# Patient Record
Sex: Female | Born: 1955 | Race: White | Hispanic: No | Smoking: Never smoker
Health system: Southern US, Community
[De-identification: ages and names within clinical notes are randomized; demographics above are authoritative.]

## PROBLEM LIST (undated history)

## (undated) DIAGNOSIS — I10 Essential (primary) hypertension: Secondary | ICD-10-CM

## (undated) DIAGNOSIS — Z780 Asymptomatic menopausal state: Secondary | ICD-10-CM

## (undated) DIAGNOSIS — G43909 Migraine, unspecified, not intractable, without status migrainosus: Secondary | ICD-10-CM

## (undated) DIAGNOSIS — E785 Hyperlipidemia, unspecified: Secondary | ICD-10-CM

## (undated) DIAGNOSIS — L817 Pigmented purpuric dermatosis: Secondary | ICD-10-CM

## (undated) HISTORY — DX: Asymptomatic menopausal state: Z78.0

## (undated) HISTORY — PX: CHOLECYSTECTOMY: SHX55

## (undated) HISTORY — PX: TONSILLECTOMY: SUR1361

## (undated) HISTORY — DX: Pigmented purpuric dermatosis: L81.7

## (undated) HISTORY — PX: FOOT SURGERY: SHX648

## (undated) HISTORY — DX: Migraine, unspecified, not intractable, without status migrainosus: G43.909

## (undated) HISTORY — DX: Hyperlipidemia, unspecified: E78.5

## (undated) HISTORY — DX: Essential (primary) hypertension: I10

---

## 1997-04-14 ENCOUNTER — Ambulatory Visit (HOSPITAL_BASED_OUTPATIENT_CLINIC_OR_DEPARTMENT_OTHER): Admission: RE | Admit: 1997-04-14 | Discharge: 1997-04-14 | Payer: Self-pay | Admitting: Orthopedic Surgery

## 1997-06-01 ENCOUNTER — Ambulatory Visit (HOSPITAL_COMMUNITY): Admission: RE | Admit: 1997-06-01 | Discharge: 1997-06-01 | Payer: Self-pay | Admitting: *Deleted

## 1998-12-01 ENCOUNTER — Other Ambulatory Visit: Admission: RE | Admit: 1998-12-01 | Discharge: 1998-12-01 | Payer: Self-pay | Admitting: *Deleted

## 1999-02-25 ENCOUNTER — Emergency Department (HOSPITAL_COMMUNITY): Admission: EM | Admit: 1999-02-25 | Discharge: 1999-02-25 | Payer: Self-pay | Admitting: Emergency Medicine

## 1999-12-14 ENCOUNTER — Other Ambulatory Visit: Admission: RE | Admit: 1999-12-14 | Discharge: 1999-12-14 | Payer: Self-pay | Admitting: *Deleted

## 2000-04-30 ENCOUNTER — Ambulatory Visit (HOSPITAL_COMMUNITY): Admission: RE | Admit: 2000-04-30 | Discharge: 2000-04-30 | Payer: Self-pay | Admitting: Gastroenterology

## 2000-05-21 ENCOUNTER — Ambulatory Visit (HOSPITAL_COMMUNITY): Admission: RE | Admit: 2000-05-21 | Discharge: 2000-05-21 | Payer: Self-pay | Admitting: *Deleted

## 2000-05-21 ENCOUNTER — Encounter: Payer: Self-pay | Admitting: *Deleted

## 2000-05-22 ENCOUNTER — Encounter: Payer: Self-pay | Admitting: *Deleted

## 2000-05-22 ENCOUNTER — Encounter: Admission: RE | Admit: 2000-05-22 | Discharge: 2000-05-22 | Payer: Self-pay | Admitting: *Deleted

## 2000-11-18 ENCOUNTER — Encounter: Payer: Self-pay | Admitting: *Deleted

## 2000-11-18 ENCOUNTER — Encounter: Admission: RE | Admit: 2000-11-18 | Discharge: 2000-11-18 | Payer: Self-pay | Admitting: *Deleted

## 2000-12-15 ENCOUNTER — Other Ambulatory Visit: Admission: RE | Admit: 2000-12-15 | Discharge: 2000-12-15 | Payer: Self-pay | Admitting: *Deleted

## 2001-05-29 ENCOUNTER — Encounter: Payer: Self-pay | Admitting: *Deleted

## 2001-05-29 ENCOUNTER — Ambulatory Visit (HOSPITAL_COMMUNITY): Admission: RE | Admit: 2001-05-29 | Discharge: 2001-05-29 | Payer: Self-pay | Admitting: *Deleted

## 2002-01-28 ENCOUNTER — Other Ambulatory Visit: Admission: RE | Admit: 2002-01-28 | Discharge: 2002-01-28 | Payer: Self-pay | Admitting: *Deleted

## 2002-05-19 ENCOUNTER — Ambulatory Visit (HOSPITAL_COMMUNITY): Admission: RE | Admit: 2002-05-19 | Discharge: 2002-05-19 | Payer: Self-pay | Admitting: *Deleted

## 2002-05-19 ENCOUNTER — Encounter: Payer: Self-pay | Admitting: *Deleted

## 2003-02-03 ENCOUNTER — Other Ambulatory Visit: Admission: RE | Admit: 2003-02-03 | Discharge: 2003-02-03 | Payer: Self-pay | Admitting: *Deleted

## 2003-05-20 ENCOUNTER — Ambulatory Visit (HOSPITAL_COMMUNITY): Admission: RE | Admit: 2003-05-20 | Discharge: 2003-05-20 | Payer: Self-pay | Admitting: *Deleted

## 2004-02-09 ENCOUNTER — Other Ambulatory Visit: Admission: RE | Admit: 2004-02-09 | Discharge: 2004-02-09 | Payer: Self-pay | Admitting: *Deleted

## 2004-05-22 ENCOUNTER — Ambulatory Visit (HOSPITAL_COMMUNITY): Admission: RE | Admit: 2004-05-22 | Discharge: 2004-05-22 | Payer: Self-pay | Admitting: *Deleted

## 2005-05-23 ENCOUNTER — Encounter: Admission: RE | Admit: 2005-05-23 | Discharge: 2005-05-23 | Payer: Self-pay | Admitting: Internal Medicine

## 2005-05-27 ENCOUNTER — Encounter: Admission: RE | Admit: 2005-05-27 | Discharge: 2005-05-27 | Payer: Self-pay | Admitting: Internal Medicine

## 2006-05-26 ENCOUNTER — Encounter: Admission: RE | Admit: 2006-05-26 | Discharge: 2006-05-26 | Payer: Self-pay | Admitting: Obstetrics and Gynecology

## 2006-06-11 ENCOUNTER — Other Ambulatory Visit: Admission: RE | Admit: 2006-06-11 | Discharge: 2006-06-11 | Payer: Self-pay | Admitting: Obstetrics and Gynecology

## 2007-05-28 ENCOUNTER — Encounter: Admission: RE | Admit: 2007-05-28 | Discharge: 2007-05-28 | Payer: Self-pay | Admitting: Obstetrics and Gynecology

## 2007-06-15 ENCOUNTER — Other Ambulatory Visit: Admission: RE | Admit: 2007-06-15 | Discharge: 2007-06-15 | Payer: Self-pay | Admitting: Obstetrics & Gynecology

## 2007-06-23 ENCOUNTER — Encounter: Admission: RE | Admit: 2007-06-23 | Discharge: 2007-06-23 | Payer: Self-pay | Admitting: Obstetrics and Gynecology

## 2008-06-06 ENCOUNTER — Encounter: Admission: RE | Admit: 2008-06-06 | Discharge: 2008-06-06 | Payer: Self-pay | Admitting: Obstetrics and Gynecology

## 2009-06-04 ENCOUNTER — Emergency Department (HOSPITAL_COMMUNITY): Admission: EM | Admit: 2009-06-04 | Discharge: 2009-06-04 | Payer: Self-pay | Admitting: Emergency Medicine

## 2009-06-30 ENCOUNTER — Encounter: Admission: RE | Admit: 2009-06-30 | Discharge: 2009-06-30 | Payer: Self-pay | Admitting: Internal Medicine

## 2010-06-01 NOTE — Procedures (Signed)
[place]  Patient:    Jordan Haynes, Jordan Haynes                  MRN: [id number] Proc. Date: 04/30/00 Adm. Date:  [id number] Attending:  Rich Brave CC:         Tomi Bamberger, FNP, [facility]  Family Medicine   Procedure Report  PROCEDURE PERFORMED:  Colonoscopy  ENDOSCOPIST:  Florencia Reasons, M.D.  INDICATIONS FOR PROCEDURE:  The patient is a 55 year old with a family history of colon cancer at a very early age.  Her most recent screening examination, by me, was performed about five years ago and was negative for polyps at that time.  FINDINGS:  Normal except for sigmoid diverticulosis.  DESCRIPTION OF PROCEDURE:  The nature, purpose and risks of the procedure had been discussed with the patient, who provided written consent for the procedure.  Sedation was fentanyl 80 mcg and Versed 8 mg IV prior to and during the course of the procedure without arrhythmias or desaturation.  The Olympus adjustable tension pediatric video colonoscope was quite easily advanced to the cecum and pull-back was then performed.  The cecum was identified by clear visualization of the appendiceal orifice.  The quality of the prep was very good although we did have to use a fair amount of irrigation to get rid of film.  It was felt that all areas were well seen.  There was a moderate amount of sigmoid diverticulosis but this was otherwise a normal examination, without evidence of polyps, cancer, colitis or vascular malformations.  I thought I saw a small 2 x 3 mm sessile pale polyp on a fold in the ascending colon but I examined and re-examined that same area and was unable to convince myself of any persistent lesion, so it was probably just a little bit of redundant mucosa.  Retroflexion in the rectum was unremarkable.  No biopsies were obtained.  The patient tolerated the procedure well and there were no apparent complications.  IMPRESSION:   Sigmoid diverticulosis.  Otherwise normal exam in a patient with a family history of colon cancer.  PLAN:  Follow-up colonoscopy in five years. DD:  04/30/00 TD:  04/30/00 Job: [id number]

## 2010-06-18 ENCOUNTER — Other Ambulatory Visit: Payer: Self-pay | Admitting: Obstetrics and Gynecology

## 2010-06-18 DIAGNOSIS — Z1231 Encounter for screening mammogram for malignant neoplasm of breast: Secondary | ICD-10-CM

## 2010-07-03 ENCOUNTER — Ambulatory Visit
Admission: RE | Admit: 2010-07-03 | Discharge: 2010-07-03 | Disposition: A | Discharge status: Home / Self Care | Payer: BC Managed Care – PPO | Source: Ambulatory Visit | Attending: Obstetrics and Gynecology | Admitting: Obstetrics and Gynecology

## 2010-07-03 DIAGNOSIS — Z1231 Encounter for screening mammogram for malignant neoplasm of breast: Secondary | ICD-10-CM

## 2010-10-08 ENCOUNTER — Other Ambulatory Visit: Payer: Self-pay | Admitting: *Deleted

## 2010-10-08 ENCOUNTER — Other Ambulatory Visit (HOSPITAL_BASED_OUTPATIENT_CLINIC_OR_DEPARTMENT_OTHER): Payer: Self-pay | Admitting: Internal Medicine

## 2010-10-08 ENCOUNTER — Ambulatory Visit
Admission: RE | Admit: 2010-10-08 | Discharge: 2010-10-08 | Disposition: A | Discharge status: Home / Self Care | Payer: BC Managed Care – PPO | Source: Ambulatory Visit | Attending: Internal Medicine | Admitting: Internal Medicine

## 2010-10-08 DIAGNOSIS — R05 Cough: Secondary | ICD-10-CM

## 2010-10-08 DIAGNOSIS — R059 Cough, unspecified: Secondary | ICD-10-CM

## 2011-05-07 ENCOUNTER — Ambulatory Visit
Admission: RE | Admit: 2011-05-07 | Discharge: 2011-05-07 | Disposition: A | Discharge status: Home / Self Care | Payer: BC Managed Care – PPO | Source: Ambulatory Visit | Attending: Internal Medicine | Admitting: Internal Medicine

## 2011-05-07 ENCOUNTER — Other Ambulatory Visit: Payer: Self-pay | Admitting: Internal Medicine

## 2011-05-07 DIAGNOSIS — R52 Pain, unspecified: Secondary | ICD-10-CM

## 2011-06-05 ENCOUNTER — Other Ambulatory Visit: Payer: Self-pay | Admitting: Internal Medicine

## 2011-06-05 DIAGNOSIS — Z1231 Encounter for screening mammogram for malignant neoplasm of breast: Secondary | ICD-10-CM

## 2011-07-04 ENCOUNTER — Ambulatory Visit
Admission: RE | Admit: 2011-07-04 | Discharge: 2011-07-04 | Disposition: A | Discharge status: Home / Self Care | Payer: BC Managed Care – PPO | Source: Ambulatory Visit | Attending: Internal Medicine | Admitting: Internal Medicine

## 2011-07-04 DIAGNOSIS — Z1231 Encounter for screening mammogram for malignant neoplasm of breast: Secondary | ICD-10-CM

## 2011-10-29 ENCOUNTER — Encounter: Payer: Self-pay | Admitting: Internal Medicine

## 2011-10-29 ENCOUNTER — Ambulatory Visit (INDEPENDENT_AMBULATORY_CARE_PROVIDER_SITE_OTHER): Payer: BC Managed Care – PPO | Admitting: Internal Medicine

## 2011-10-29 VITALS — BP 136/80 | HR 66 | Temp 97.5°F | Resp 18 | Wt 132.0 lb

## 2011-10-29 DIAGNOSIS — Z780 Asymptomatic menopausal state: Secondary | ICD-10-CM

## 2011-10-29 DIAGNOSIS — D126 Benign neoplasm of colon, unspecified: Secondary | ICD-10-CM

## 2011-10-29 DIAGNOSIS — Z8639 Personal history of other endocrine, nutritional and metabolic disease: Secondary | ICD-10-CM

## 2011-10-29 DIAGNOSIS — K635 Polyp of colon: Secondary | ICD-10-CM

## 2011-10-29 DIAGNOSIS — Z800 Family history of malignant neoplasm of digestive organs: Secondary | ICD-10-CM

## 2011-10-29 DIAGNOSIS — I10 Essential (primary) hypertension: Secondary | ICD-10-CM

## 2011-10-29 DIAGNOSIS — N951 Menopausal and female climacteric states: Secondary | ICD-10-CM

## 2011-10-29 DIAGNOSIS — E785 Hyperlipidemia, unspecified: Secondary | ICD-10-CM

## 2011-10-29 DIAGNOSIS — Z8669 Personal history of other diseases of the nervous system and sense organs: Secondary | ICD-10-CM

## 2011-10-29 DIAGNOSIS — Z23 Encounter for immunization: Secondary | ICD-10-CM

## 2011-10-29 LAB — BASIC METABOLIC PANEL
BUN: 10 mg/dL (ref 6–23)
CO2: 30 mEq/L (ref 19–32)
Calcium: 9.7 mg/dL (ref 8.4–10.5)
Chloride: 102 mEq/L (ref 96–112)
Creat: 0.7 mg/dL (ref 0.50–1.10)
Glucose, Bld: 91 mg/dL (ref 70–99)
Potassium: 3.9 mEq/L (ref 3.5–5.3)
Sodium: 141 mEq/L (ref 135–145)

## 2011-10-29 LAB — LIPID PANEL
Cholesterol: 157 mg/dL (ref 0–200)
HDL: 65 mg/dL (ref >39)
LDL Cholesterol: 79 mg/dL (ref 0–99)
Total CHOL/HDL Ratio: 2.4 Ratio
Triglycerides: 65 mg/dL (ref <150)
VLDL: 13 mg/dL (ref 0–40)

## 2011-10-29 LAB — TSH: TSH: 1.875 u[IU]/mL (ref 0.350–4.500)

## 2011-10-29 MED ORDER — HYDROCHLOROTHIAZIDE 25 MG PO TABS: 25.0000 mg | ORAL_TABLET | Freq: Every day | ORAL | Status: DC

## 2011-10-29 NOTE — Progress Notes (Signed)
Subjective:    Patient ID: Jordan Haynes, female    DOB: 10-Jul-1955, 56 y.o.   MRN: [id number]  HPI  New pt here to establish care.  PMH of htn, Hyperlipidemia, colon polyps with strong FH of colon cancer (sister age 48, GF age 63), migraine headaches, and symptomatic menopause  She states she has been on 1/2 pill of Activella for the past 4-5 years for menopausal flushes.  Former GYN Dr. Clelia Croft and [facility].  She denies vagina spotting  Had colonoscopy Dr. Matthias Hughs 2013  Pap and mm done 06/2011  No personal of FH of breast cancer, Gyn cancer, no DVT/PE or CVA or MI.  Mother had TIA  She reports she has been told she had a high K in the past and was seen by the [facility] nephrology group for this.  She brings her most recent labs done 07/12/2011 at [facility].  K 5.3 at that time  She has been on HCTZ for the last month for BP.   Tolerating well  She has been on Lipitor 10 mg for the past 3-4 months.  LDL was 134 total 213 prior to initiation.  She has never tried to change diet given the values listed.  / No Known Allergies Past Medical History  Diagnosis Date  . Hyperlipidemia   . Hypertension   . Migraine   . Menopause    Past Surgical History  Procedure Date  . Cholecystectomy   . Tonsillectomy   . Foot surgery     bunion removal   History   Social History  . Marital Status: Married    Spouse Name: N/A    Number of Children: N/A  . Years of Education: N/A   Occupational History  . Not on file.   Social History Main Topics  . Smoking status: Never Smoker   . Smokeless tobacco: Never Used  . Alcohol Use: Not on file  . Drug Use: No  . Sexually Active: Yes    Birth Control/ Protection: Surgical   Other Topics Concern  . Not on file   Social History Narrative  . No narrative on file   Family History  Problem Relation Age of Onset  . Cancer Father   . Cancer Sister   . Cancer Paternal Grandfather    Patient Active Problem List  Diagnosis  .  Essential hypertension, benign  . Other and unspecified hyperlipidemia   Current Outpatient Prescriptions on File Prior to Visit  Medication Sig Dispense Refill  . atorvastatin (LIPITOR) 10 MG tablet Take 10 mg by mouth daily.      Marland Kitchen estradiol (ESTRACE) 1 MG tablet Take 0.5 mg by mouth daily.      . hydrochlorothiazide (HYDRODIURIL) 25 MG tablet Take 25 mg by mouth daily.         Review of Systems See HPI    Objective:   Physical Exam Physical Exam  Nursing note and vitals reviewed.  Constitutional: She is oriented to person, place, and time. She appears well-developed and well-nourished.  HENT:  Head: Normocephalic and atraumatic.  Cardiovascular: Normal rate and regular rhythm. Exam reveals no gallop and no friction rub.  No murmur heard.  Pulmonary/Chest: Breath sounds normal. She has no wheezes. She has no rales.  Neurological: She is alert and oriented to person, place, and time.  Skin: Skin is warm and dry.  Psychiatric: She has a normal mood and affect. Her behavior is normal.  Assessment & Plan:  HTN  Check bmp today.  Will continue HCTZ  Hyperlipidemia  Check today.  She would like to try to come off Llipitor for now and try dASH diet.  Copy of DASH diet given.   Will recheck fasting levels today and recheck in 6 months  Elevated K   Check today.   Diuretic good choice for her HTN  Menopausal hot flushes  Gave pt handout of risk/benefit sheet of HT.  Will further discuss next visit  Vaginal dryness  Advised to stop Premarin vaginal creme and gave sample of Luvena  Nightly for 2 weeks then 2 times per week and with intercourse  History of vitamin D deficiency  Vitamin D in June 31.4

## 2011-10-29 NOTE — Patient Instructions (Addendum)
See me in two months  Use luvena  OK to come off Premarin cream

## 2011-10-30 ENCOUNTER — Encounter: Payer: Self-pay | Admitting: *Deleted

## 2011-10-30 ENCOUNTER — Encounter: Payer: Self-pay | Admitting: Internal Medicine

## 2011-10-30 ENCOUNTER — Telehealth: Payer: Self-pay | Admitting: *Deleted

## 2011-10-30 DIAGNOSIS — Z8639 Personal history of other endocrine, nutritional and metabolic disease: Secondary | ICD-10-CM | POA: Insufficient documentation

## 2011-10-30 DIAGNOSIS — N951 Menopausal and female climacteric states: Secondary | ICD-10-CM | POA: Insufficient documentation

## 2011-10-30 DIAGNOSIS — Z780 Asymptomatic menopausal state: Secondary | ICD-10-CM | POA: Insufficient documentation

## 2011-10-30 DIAGNOSIS — Z800 Family history of malignant neoplasm of digestive organs: Secondary | ICD-10-CM | POA: Insufficient documentation

## 2011-10-30 DIAGNOSIS — K635 Polyp of colon: Secondary | ICD-10-CM | POA: Insufficient documentation

## 2011-10-30 DIAGNOSIS — G43909 Migraine, unspecified, not intractable, without status migrainosus: Secondary | ICD-10-CM | POA: Insufficient documentation

## 2011-10-30 NOTE — Telephone Encounter (Signed)
Message copied by Mathews Robinsons on Wed Oct 30, 2011  3:54 PM ------      Message from: Raechel Chute D      Created: Wed Oct 30, 2011 10:20 AM       Ok to mail labs to pt

## 2011-10-30 NOTE — Telephone Encounter (Signed)
Labs mailed to home address.  

## 2011-11-01 ENCOUNTER — Encounter: Payer: Self-pay | Admitting: *Deleted

## 2011-12-31 ENCOUNTER — Ambulatory Visit: Payer: BC Managed Care – PPO | Admitting: Internal Medicine

## 2012-01-13 ENCOUNTER — Ambulatory Visit (INDEPENDENT_AMBULATORY_CARE_PROVIDER_SITE_OTHER): Payer: BC Managed Care – PPO | Admitting: Internal Medicine

## 2012-01-13 ENCOUNTER — Encounter: Payer: Self-pay | Admitting: Internal Medicine

## 2012-01-13 VITALS — BP 118/72 | HR 67 | Temp 97.1°F | Resp 16 | Wt 133.0 lb

## 2012-01-13 DIAGNOSIS — E785 Hyperlipidemia, unspecified: Secondary | ICD-10-CM

## 2012-01-13 DIAGNOSIS — I10 Essential (primary) hypertension: Secondary | ICD-10-CM

## 2012-01-13 DIAGNOSIS — N951 Menopausal and female climacteric states: Secondary | ICD-10-CM | POA: Insufficient documentation

## 2012-01-13 NOTE — Progress Notes (Signed)
  Subjective:    Patient ID: Jordan Haynes, female    DOB: 10/09/1955, 56 y.o.   MRN: [id number]  HPI Jordan Haynes is here for follow up  Hyperlipidemia  She is off statin now  Vaginal dryness  She is using [place] and really like it.  She has not had any vaginal irritation at all  HTN  Well controlled on HCTZ  K normal  No Known Allergies Past Medical History  Diagnosis Date  . Hyperlipidemia   . Hypertension   . Migraine   . Menopause    Past Surgical History  Procedure Date  . Cholecystectomy   . Tonsillectomy   . Foot surgery     bunion removal   History   Social History  . Marital Status: Married    Spouse Name: N/A    Number of Children: N/A  . Years of Education: N/A   Occupational History  . Not on file.   Social History Main Topics  . Smoking status: Never Smoker   . Smokeless tobacco: Never Used  . Alcohol Use: Not on file  . Drug Use: No  . Sexually Active: Yes    Birth Control/ Protection: Surgical   Other Topics Concern  . Not on file   Social History Narrative  . No narrative on file   Family History  Problem Relation Age of Onset  . Cancer Father   . Cancer Sister   . Cancer Paternal Grandfather    Patient Active Problem List  Diagnosis  . Essential hypertension, benign  . Other and unspecified hyperlipidemia  . Menopause  . Menopausal hot flushes  . History of migraine headaches  . Colon polyps  . FH: colon cancer  . History of vitamin D deficiency   Current Outpatient Prescriptions on File Prior to Visit  Medication Sig Dispense Refill  . estradiol-norethindrone (ACTIVELLA) 1-0.5 MG per tablet Take 1 tablet by mouth daily. Take 1/2 tablet daily      . hydrochlorothiazide (HYDRODIURIL) 25 MG tablet Take 1 tablet (25 mg total) by mouth daily.  90 tablet  1  . fish oil-omega-3 fatty acids 1000 MG capsule Take 2 g by mouth daily.           Review of Systems    see HPI Objective:   Physical Exam  Physical Exam  Nursing  note and vitals reviewed.  Constitutional: She is oriented to person, place, and time. She appears well-developed and well-nourished.  HENT:  Head: Normocephalic and atraumatic.  Cardiovascular: Normal rate and regular rhythm. Exam reveals no gallop and no friction rub.  No murmur heard.  Pulmonary/Chest: Breath sounds normal. She has no wheezes. She has no rales.  Neurological: She is alert and oriented to person, place, and time.  Skin: Skin is warm and dry.  Psychiatric: She has a normal mood and affect. Her behavior is normal.  Ext:  No edema       Assessment & Plan:  HTN:  Continue HCTZ  Hyperlipidemia:  Will have fasting labs done in about 2 weeks  Vaginal dryness/irrtiation  Continue Luvena 2-3 times per week  See me as needed

## 2012-01-13 NOTE — Patient Instructions (Signed)
See me as needed 

## 2012-02-29 ENCOUNTER — Other Ambulatory Visit: Payer: Self-pay

## 2012-04-14 ENCOUNTER — Telehealth: Payer: Self-pay | Admitting: *Deleted

## 2012-04-14 DIAGNOSIS — E785 Hyperlipidemia, unspecified: Secondary | ICD-10-CM

## 2012-04-14 NOTE — Telephone Encounter (Signed)
Lipid panel ordered and faxed to Pt. Pt states that she will have labs drawn at a [facility] location on Thursday

## 2012-04-16 LAB — LIPID PANEL
Cholesterol: 212 mg/dL — ABNORMAL HIGH (ref 0–200)
HDL: 49 mg/dL (ref >39)
LDL Cholesterol: 150 mg/dL — ABNORMAL HIGH (ref 0–99)
Total CHOL/HDL Ratio: 4.3 Ratio
Triglycerides: 64 mg/dL (ref <150)
VLDL: 13 mg/dL (ref 0–40)

## 2012-04-22 ENCOUNTER — Encounter: Payer: Self-pay | Admitting: *Deleted

## 2012-04-22 ENCOUNTER — Telehealth: Payer: Self-pay | Admitting: *Deleted

## 2012-04-22 NOTE — Telephone Encounter (Signed)
Notified pt of cholesterol and her need for an appt Pt made an appt for 4/23 at 3;15

## 2012-04-22 NOTE — Telephone Encounter (Signed)
Message copied by Mathews Robinsons on Wed Apr 22, 2012  3:00 PM ------      Message from: Raechel Chute D      Created: Tue Apr 21, 2012 11:20 AM       Karen Kitchens            Call pt and let her know that her bad cholesterol is high.  Total cholesterol not too bad.  Have her make appt with me and I will do a risk analysis for her.              Message back with her response.  OK to mail labs to her ------

## 2012-04-22 NOTE — Telephone Encounter (Signed)
Message copied by Mathews Robinsons on Wed Apr 22, 2012  2:58 PM ------      Message from: Raechel Chute D      Created: Tue Apr 21, 2012 11:20 AM       Karen Kitchens            Call pt and let her know that her bad cholesterol is high.  Total cholesterol not too bad.  Have her make appt with me and I will do a risk analysis for her.              Message back with her response.  OK to mail labs to her ------

## 2012-05-06 ENCOUNTER — Encounter: Payer: Self-pay | Admitting: Internal Medicine

## 2012-05-06 ENCOUNTER — Ambulatory Visit (INDEPENDENT_AMBULATORY_CARE_PROVIDER_SITE_OTHER): Payer: BC Managed Care – PPO | Admitting: Internal Medicine

## 2012-05-06 ENCOUNTER — Ambulatory Visit: Payer: BC Managed Care – PPO | Admitting: Internal Medicine

## 2012-05-06 VITALS — BP 110/70 | HR 83 | Temp 97.5°F | Resp 18 | Wt 131.0 lb

## 2012-05-06 DIAGNOSIS — I10 Essential (primary) hypertension: Secondary | ICD-10-CM

## 2012-05-06 DIAGNOSIS — Z780 Asymptomatic menopausal state: Secondary | ICD-10-CM

## 2012-05-06 DIAGNOSIS — E785 Hyperlipidemia, unspecified: Secondary | ICD-10-CM

## 2012-05-06 MED ORDER — ESTRADIOL-NORETHINDRONE ACET 1-0.5 MG PO TABS: ORAL_TABLET | ORAL | Status: DC

## 2012-05-06 MED ORDER — ATORVASTATIN CALCIUM 10 MG PO TABS: ORAL_TABLET | ORAL | Status: DC

## 2012-05-06 NOTE — Patient Instructions (Addendum)
See me at CPE appt

## 2012-05-06 NOTE — Progress Notes (Signed)
  Subjective:    Patient ID: Jordan Haynes, female    DOB: [birth year], 56 y.o.   MRN: [id number]  HPI  Jordan Haynes is here for follow up after stopping her lipitor 10 mg approx 6 months ago.  See labs  FH of CVA in grandparents.    She has been on 1/2 Activella for the past 5 years.  No hot flushes now  No Known Allergies Past Medical History  Diagnosis Date  . Hyperlipidemia   . Hypertension   . Migraine   . Menopause    Past Surgical History  Procedure Laterality Date  . Cholecystectomy    . Tonsillectomy    . Foot surgery      bunion removal   History   Social History  . Marital Status: Married    Spouse Name: N/A    Number of Children: N/A  . Years of Education: N/A   Occupational History  . Not on file.   Social History Main Topics  . Smoking status: Never Smoker   . Smokeless tobacco: Never Used  . Alcohol Use: Not on file  . Drug Use: No  . Sexually Active: Yes    Birth Control/ Protection: Surgical   Other Topics Concern  . Not on file   Social History Narrative  . No narrative on file   Family History  Problem Relation Age of Onset  . Cancer Father   . Cancer Sister   . Cancer Paternal Grandfather    Patient Active Problem List  Diagnosis  . Essential hypertension, benign  . Other and unspecified hyperlipidemia  . Menopause  . Menopausal hot flushes  . History of migraine headaches  . Colon polyps  . FH: colon cancer  . History of vitamin D deficiency  . Vaginal dryness, menopausal   Current Outpatient Prescriptions on File Prior to Visit  Medication Sig Dispense Refill  . fish oil-omega-3 fatty acids 1000 MG capsule Take 2 g by mouth daily.      . hydrochlorothiazide (HYDRODIURIL) 25 MG tablet Take 1 tablet (25 mg total) by mouth daily.  90 tablet  1   No current facility-administered medications on file prior to visit.      Review of Systems See HPI    Objective:   Physical Exam  Physical Exam  Nursing note and vitals  reviewed.  Constitutional: She is oriented to person, place, and time. She appears well-developed and well-nourished.  HENT:  Head: Normocephalic and atraumatic.  Cardiovascular: Normal rate and regular rhythm. Exam reveals no gallop and no friction rub.  No murmur heard.  Pulmonary/Chest: Breath sounds normal. She has no wheezes. She has no rales.  Neurological: She is alert and oriented to person, place, and time.  Skin: Skin is warm and dry.  Psychiatric: She has a normal mood and affect. Her behavior is normal.            Assessment & Plan:  Hyperlipidemia  Will initiate lipitor 10 mg and pt can try qod for now.  Recheck fasting lipids in June at CPE  Menopause  Pt willing to take 1/2 of activella qod for now.   She is aware of risk of breast cancer,  MI, CVA and DVT.  She is to report if she has any hot flushes  See me at CPE

## 2012-06-03 ENCOUNTER — Other Ambulatory Visit: Payer: Self-pay

## 2012-06-03 DIAGNOSIS — Z1231 Encounter for screening mammogram for malignant neoplasm of breast: Secondary | ICD-10-CM

## 2012-07-07 ENCOUNTER — Ambulatory Visit
Admission: RE | Admit: 2012-07-07 | Discharge: 2012-07-07 | Disposition: A | Discharge status: Home / Self Care | Payer: BC Managed Care – PPO | Source: Ambulatory Visit

## 2012-07-07 DIAGNOSIS — Z1231 Encounter for screening mammogram for malignant neoplasm of breast: Secondary | ICD-10-CM

## 2012-07-28 ENCOUNTER — Ambulatory Visit (INDEPENDENT_AMBULATORY_CARE_PROVIDER_SITE_OTHER): Payer: BC Managed Care – PPO | Admitting: Internal Medicine

## 2012-07-28 ENCOUNTER — Encounter: Payer: Self-pay | Admitting: Internal Medicine

## 2012-07-28 VITALS — BP 125/74 | HR 58 | Temp 97.9°F | Resp 18 | Wt 131.0 lb

## 2012-07-28 DIAGNOSIS — L309 Dermatitis, unspecified: Secondary | ICD-10-CM

## 2012-07-28 DIAGNOSIS — L259 Unspecified contact dermatitis, unspecified cause: Secondary | ICD-10-CM

## 2012-07-28 LAB — BASIC METABOLIC PANEL
BUN: 10 mg/dL (ref 6–23)
CO2: 31 mEq/L (ref 19–32)
Calcium: 9.5 mg/dL (ref 8.4–10.5)
Chloride: 100 mEq/L (ref 96–112)
Creat: 0.67 mg/dL (ref 0.50–1.10)
Glucose, Bld: 87 mg/dL (ref 70–99)
Potassium: 3.5 mEq/L (ref 3.5–5.3)
Sodium: 140 mEq/L (ref 135–145)

## 2012-07-28 LAB — SEDIMENTATION RATE: Sed Rate: 1 mm/hr (ref 0–22)

## 2012-07-28 MED ORDER — MOMETASONE FUROATE 0.1 % EX CREA: TOPICAL_CREAM | Freq: Every day | CUTANEOUS | Status: DC

## 2012-07-28 NOTE — Patient Instructions (Addendum)
If not better  Make appt to see the dermatologist  If fever or headache  See me in office

## 2012-07-28 NOTE — Progress Notes (Signed)
Subjective:    Patient ID: Jordan Haynes, female    DOB: 05-19-1955, 57 y.o.   MRN: [id number]  HPI  Jordan Haynes is here for acute visit.   She has noticed red itchy rash over her extremitites  No insect or tick exposure,  No grass cutting no hiking no gardening.   She did use a new lotion in the last week.   No fever no headaches  Does not feel sick  No new meds  No Known Allergies Past Medical History  Diagnosis Date  . Hyperlipidemia   . Hypertension   . Migraine   . Menopause    Past Surgical History  Procedure Laterality Date  . Cholecystectomy    . Tonsillectomy    . Foot surgery      bunion removal   History   Social History  . Marital Status: Married    Spouse Name: N/A    Number of Children: N/A  . Years of Education: N/A   Occupational History  . Not on file.   Social History Main Topics  . Smoking status: Never Smoker   . Smokeless tobacco: Never Used  . Alcohol Use: Not on file  . Drug Use: No  . Sexually Active: Yes    Birth Control/ Protection: Surgical   Other Topics Concern  . Not on file   Social History Narrative  . No narrative on file   Family History  Problem Relation Age of Onset  . Cancer Father   . Cancer Sister   . Cancer Paternal Grandfather    Patient Active Problem List   Diagnosis Date Noted  . Vaginal dryness, menopausal 01/13/2012  . Menopause 10/30/2011  . Menopausal hot flushes 10/30/2011  . History of migraine headaches 10/30/2011  . Colon polyps 10/30/2011  . FH: colon cancer 10/30/2011  . History of vitamin D deficiency 10/30/2011  . Essential hypertension, benign 10/29/2011  . Other and unspecified hyperlipidemia 10/29/2011   Current Outpatient Prescriptions on File Prior to Visit  Medication Sig Dispense Refill  . atorvastatin (LIPITOR) 10 MG tablet Take one tablet every other day  45 tablet  1  . fish oil-omega-3 fatty acids 1000 MG capsule Take 2 g by mouth daily.      . hydrochlorothiazide  (HYDRODIURIL) 25 MG tablet Take 1 tablet (25 mg total) by mouth daily.  90 tablet  1  . estradiol-norethindrone (ACTIVELLA) 1-0.5 MG per tablet Take 1/2 tablet every other day  20 tablet  0   No current facility-administered medications on file prior to visit.      Review of Systems See HPI    Objective:   Physical Exam  Physical Exam  Nursing note and vitals reviewed.  Constitutional: She is oriented to person, place, and time. She appears well-developed and well-nourished.  HENT:  Head: Normocephalic and atraumatic.  Cardiovascular: Normal rate and regular rhythm. Exam reveals no gallop and no friction rub.  No murmur heard.  Pulmonary/Chest: Breath sounds normal. She has no wheezes. She has no rales.  Neurological: She is alert and oriented to person, place, and time.  Skin: Skin is warm and dry. She has reddened small maculopapular lesion over lower legs and upper arms only  Psychiatric: She has a normal mood and affect. Her behavior is normal.        Assessment & Plan:  Dermatitis :  Will give Elocon cream daily.    Counseled if not better to see her dermatologist.  Also counseled if any fever  headache to return to office.  She voices uderstanding

## 2012-07-29 ENCOUNTER — Telehealth: Payer: Self-pay | Admitting: *Deleted

## 2012-07-29 ENCOUNTER — Encounter: Payer: Self-pay | Admitting: *Deleted

## 2012-07-29 LAB — ANA: Anti Nuclear Antibody(ANA): NEGATIVE

## 2012-07-29 NOTE — Telephone Encounter (Signed)
Message copied by Mathews Robinsons on Wed Jul 29, 2012  3:15 PM ------      Message from: Raechel Chute D      Created: Wed Jul 29, 2012  2:19 PM       Ok to mail labs to pt ------

## 2012-09-03 ENCOUNTER — Other Ambulatory Visit: Payer: Self-pay | Admitting: *Deleted

## 2012-09-03 ENCOUNTER — Telehealth: Payer: Self-pay | Admitting: *Deleted

## 2012-09-03 DIAGNOSIS — Z800 Family history of malignant neoplasm of digestive organs: Secondary | ICD-10-CM

## 2012-09-03 DIAGNOSIS — E785 Hyperlipidemia, unspecified: Secondary | ICD-10-CM

## 2012-09-03 DIAGNOSIS — I10 Essential (primary) hypertension: Secondary | ICD-10-CM

## 2012-09-03 DIAGNOSIS — N951 Menopausal and female climacteric states: Secondary | ICD-10-CM

## 2012-09-03 DIAGNOSIS — Z8639 Personal history of other endocrine, nutritional and metabolic disease: Secondary | ICD-10-CM

## 2012-09-04 LAB — LIPID PANEL
Cholesterol: 150 mg/dL (ref 0–200)
HDL: 53 mg/dL (ref >39)
LDL Cholesterol: 84 mg/dL (ref 0–99)
Total CHOL/HDL Ratio: 2.8 Ratio
Triglycerides: 66 mg/dL (ref <150)
VLDL: 13 mg/dL (ref 0–40)

## 2012-09-04 LAB — CBC WITH DIFFERENTIAL/PLATELET
Basophils Absolute: 0 10*3/uL (ref 0.0–0.1)
Basophils Relative: 0 % (ref 0–1)
Eosinophils Absolute: 0.1 10*3/uL (ref 0.0–0.7)
Eosinophils Relative: 2 % (ref 0–5)
HCT: 38.2 % (ref 36.0–46.0)
Hemoglobin: 13.3 g/dL (ref 12.0–15.0)
Lymphocytes Relative: 30 % (ref 12–46)
Lymphs Abs: 1.6 10*3/uL (ref 0.7–4.0)
MCH: 29.5 pg (ref 26.0–34.0)
MCHC: 34.8 g/dL (ref 30.0–36.0)
MCV: 84.7 fL (ref 78.0–100.0)
Monocytes Absolute: 0.5 10*3/uL (ref 0.1–1.0)
Monocytes Relative: 9 % (ref 3–12)
Neutro Abs: 3.1 10*3/uL (ref 1.7–7.7)
Neutrophils Relative %: 59 % (ref 43–77)
Platelets: 246 10*3/uL (ref 150–400)
RBC: 4.51 MIL/uL (ref 3.87–5.11)
RDW: 13.2 % (ref 11.5–15.5)
WBC: 5.2 10*3/uL (ref 4.0–10.5)

## 2012-09-04 LAB — VITAMIN D 25 HYDROXY (VIT D DEFICIENCY, FRACTURES): Vit D, 25-Hydroxy: 28 ng/mL — ABNORMAL LOW (ref 30–89)

## 2012-09-04 LAB — COMPREHENSIVE METABOLIC PANEL
ALT: 23 U/L (ref 0–35)
AST: 20 U/L (ref 0–37)
Albumin: 4 g/dL (ref 3.5–5.2)
Alkaline Phosphatase: 37 U/L — ABNORMAL LOW (ref 39–117)
BUN: 10 mg/dL (ref 6–23)
CO2: 28 mEq/L (ref 19–32)
Calcium: 9.1 mg/dL (ref 8.4–10.5)
Chloride: 101 mEq/L (ref 96–112)
Creat: 0.65 mg/dL (ref 0.50–1.10)
Glucose, Bld: 71 mg/dL (ref 70–99)
Potassium: 3.4 mEq/L — ABNORMAL LOW (ref 3.5–5.3)
Sodium: 140 mEq/L (ref 135–145)
Total Bilirubin: 1 mg/dL (ref 0.3–1.2)
Total Protein: 6.2 g/dL (ref 6.0–8.3)

## 2012-09-04 LAB — TSH: TSH: 2.455 u[IU]/mL (ref 0.350–4.500)

## 2012-09-07 ENCOUNTER — Telehealth: Payer: Self-pay | Admitting: *Deleted

## 2012-09-07 ENCOUNTER — Encounter: Payer: Self-pay | Admitting: *Deleted

## 2012-09-07 ENCOUNTER — Other Ambulatory Visit: Payer: Self-pay | Admitting: Internal Medicine

## 2012-09-07 MED ORDER — POTASSIUM CHLORIDE CRYS ER 20 MEQ PO TBCR: 20.0000 meq | EXTENDED_RELEASE_TABLET | Freq: Every day | ORAL | Status: DC

## 2012-09-07 NOTE — Telephone Encounter (Signed)
Notified pt that K+

## 2012-09-07 NOTE — Telephone Encounter (Signed)
Message copied by Mathews Robinsons on Mon Sep 07, 2012  4:39 PM ------      Message from: Raechel Chute D      Created: Mon Sep 07, 2012 11:48 AM       Karen Kitchens            Call pt and let her know that her K is slightly low.  This happens with the HCTZ she take.              I will order K-dur 20 meq  One a day that she will need to take every day            Counsel her to keep her appt with me on the 28th            OK to mail labs to her ------

## 2012-09-08 NOTE — Telephone Encounter (Signed)
Error

## 2012-09-10 ENCOUNTER — Encounter: Payer: Self-pay | Admitting: Internal Medicine

## 2012-09-10 ENCOUNTER — Ambulatory Visit (INDEPENDENT_AMBULATORY_CARE_PROVIDER_SITE_OTHER): Payer: BC Managed Care – PPO | Admitting: Internal Medicine

## 2012-09-10 VITALS — BP 122/70 | HR 71 | Temp 97.3°F | Resp 18 | Wt 132.0 lb

## 2012-09-10 DIAGNOSIS — E785 Hyperlipidemia, unspecified: Secondary | ICD-10-CM

## 2012-09-10 DIAGNOSIS — I10 Essential (primary) hypertension: Secondary | ICD-10-CM

## 2012-09-10 DIAGNOSIS — D126 Benign neoplasm of colon, unspecified: Secondary | ICD-10-CM

## 2012-09-10 DIAGNOSIS — Z0000 Encounter for general adult medical examination without abnormal findings: Secondary | ICD-10-CM

## 2012-09-10 DIAGNOSIS — Z124 Encounter for screening for malignant neoplasm of cervix: Secondary | ICD-10-CM

## 2012-09-10 DIAGNOSIS — N951 Menopausal and female climacteric states: Secondary | ICD-10-CM

## 2012-09-10 DIAGNOSIS — Z1151 Encounter for screening for human papillomavirus (HPV): Secondary | ICD-10-CM

## 2012-09-10 DIAGNOSIS — K635 Polyp of colon: Secondary | ICD-10-CM

## 2012-09-10 MED ORDER — ATORVASTATIN CALCIUM 10 MG PO TABS: ORAL_TABLET | ORAL | Status: DC

## 2012-09-10 MED ORDER — ESTRADIOL-NORETHINDRONE ACET 1-0.5 MG PO TABS: ORAL_TABLET | ORAL | Status: DC

## 2012-09-10 MED ORDER — HYDROCHLOROTHIAZIDE 25 MG PO TABS: 25.0000 mg | ORAL_TABLET | Freq: Every day | ORAL | Status: DC

## 2012-09-10 NOTE — Patient Instructions (Addendum)
See me as needed   Labs today

## 2012-09-10 NOTE — Progress Notes (Signed)
Subjective:    Patient ID: Jordan Haynes, female    DOB: 17-Dec-1955, 57 y.o.   MRN: [id number]  HPI Isaura is here for CPE  Overall doing well  See labs  She is taking daily K for the last 3-4 days.  She is on daily HCTZ for her BP  She tells me she was prescribed HT initially for irritability.  She has been on HT for about 5 years  No Known Allergies Past Medical History  Diagnosis Date  . Hyperlipidemia   . Hypertension   . Migraine   . Menopause    Past Surgical History  Procedure Laterality Date  . Cholecystectomy    . Tonsillectomy    . Foot surgery      bunion removal   History   Social History  . Marital Status: Married    Spouse Name: N/A    Number of Children: N/A  . Years of Education: N/A   Occupational History  . Not on file.   Social History Main Topics  . Smoking status: Never Smoker   . Smokeless tobacco: Never Used  . Alcohol Use: No  . Drug Use: No  . Sexual Activity: Yes    Birth Control/ Protection: Surgical   Other Topics Concern  . Not on file   Social History Narrative  . No narrative on file   Family History  Problem Relation Age of Onset  . Cancer Father   . Cancer Sister   . Cancer Paternal Grandfather    Patient Active Problem List   Diagnosis Date Noted  . Vaginal dryness, menopausal 01/13/2012  . Menopause 10/30/2011  . Menopausal hot flushes 10/30/2011  . History of migraine headaches 10/30/2011  . Colon polyps 10/30/2011  . FH: colon cancer 10/30/2011  . History of vitamin D deficiency 10/30/2011  . Essential hypertension, benign 10/29/2011  . Other and unspecified hyperlipidemia 10/29/2011   Current Outpatient Prescriptions on File Prior to Visit  Medication Sig Dispense Refill  . atorvastatin (LIPITOR) 10 MG tablet Take one tablet every other day  45 tablet  1  . estradiol-norethindrone (ACTIVELLA) 1-0.5 MG per tablet Take 1/2 tablet every other day  20 tablet  0  . hydrochlorothiazide (HYDRODIURIL) 25 MG  tablet Take 1 tablet (25 mg total) by mouth daily.  90 tablet  1  . potassium chloride SA (K-DUR,KLOR-CON) 20 MEQ tablet Take 1 tablet (20 mEq total) by mouth daily.  30 tablet  3   No current facility-administered medications on file prior to visit.       Review of Systems      Objective:   Physical Exam  Physical Exam  Vital signs and nursing note reviewed  Constitutional: She is oriented to person, place, and time. She appears well-developed and well-nourished. She is cooperative.  HENT:  Head: Normocephalic and atraumatic.  Right Ear: Tympanic membrane normal.  Left Ear: Tympanic membrane normal.  Nose: Nose normal.  Mouth/Throat: Oropharynx is clear and moist and mucous membranes are normal. No oropharyngeal exudate or posterior oropharyngeal erythema.  Eyes: Conjunctivae and EOM are normal. Pupils are equal, round, and reactive to light.  Neck: Neck supple. No JVD present. Carotid bruit is not present. No mass and no thyromegaly present.  Cardiovascular: Regular rhythm, normal heart sounds, intact distal pulses and normal pulses.  Exam reveals no gallop and no friction rub.   No murmur heard. Pulses:      Dorsalis pedis pulses are 2+ on the right side, and 2+  on the left side.  Pulmonary/Chest: Breath sounds normal. She has no wheezes. She has no rhonchi. She has no rales. Right breast exhibits no mass, no nipple discharge and no skin change. Left breast exhibits no mass, no nipple discharge and no skin change.  Abdominal: Soft. Bowel sounds are normal. She exhibits no distension and no mass. There is no hepatosplenomegaly. There is no tenderness. There is no CVA tenderness.  Genitourinary: Rectum normal, vagina normal and uterus normal. Rectal exam shows no mass. Guaiac negative stool. No labial fusion. There is no lesion on the right labia. There is no lesion on the left labia. Cervix exhibits no motion tenderness. Right adnexum displays no mass, no tenderness and no  fullness. Left adnexum displays no mass, no tenderness and no fullness. No erythema around the vagina.  Musculoskeletal:       No active synovitis to any joint.    Lymphadenopathy:       Right cervical: No superficial cervical adenopathy present.      Left cervical: No superficial cervical adenopathy present.       Right axillary: No pectoral and no lateral adenopathy present.       Left axillary: No pectoral and no lateral adenopathy present.      Right: No inguinal adenopathy present.       Left: No inguinal adenopathy present.  Neurological: She is alert and oriented to person, place, and time. She has normal strength and normal reflexes. No cranial nerve deficit or sensory deficit. She displays a negative Romberg sign. Coordination and gait normal.  Skin: Skin is warm and dry. No abrasion, no bruising, no ecchymosis and no rash noted. No cyanosis. Nails show no clubbing.  Psychiatric: She has a normal mood and affect. Her speech is normal and behavior is normal.          Assessment & Plan:  Health Maintenance:   Pap today  Advised influenza vaccine  Hyperlipidemia  Impressive improvement on Lipitor qod.  No change to meds  HTN  Well controlled.  She is now taking K daily with her HCTZ  Minimal  Low K  See above  Menopause/irritability.  Given length of time on HT I feel risks outweigh any benefit for iritability.  Will continue to taper so she is off in cooler months. She voices understanding         Assessment & Plan:

## 2012-09-16 ENCOUNTER — Encounter: Payer: Self-pay | Admitting: Internal Medicine

## 2012-09-16 ENCOUNTER — Encounter: Payer: Self-pay | Admitting: *Deleted

## 2012-11-12 ENCOUNTER — Encounter: Payer: Self-pay | Admitting: Internal Medicine

## 2012-11-12 ENCOUNTER — Ambulatory Visit (INDEPENDENT_AMBULATORY_CARE_PROVIDER_SITE_OTHER): Payer: BC Managed Care – PPO | Admitting: Internal Medicine

## 2012-11-12 VITALS — BP 141/74 | HR 80 | Temp 97.7°F | Resp 20 | Wt 133.0 lb

## 2012-11-12 DIAGNOSIS — J9801 Acute bronchospasm: Secondary | ICD-10-CM

## 2012-11-12 DIAGNOSIS — R05 Cough: Secondary | ICD-10-CM

## 2012-11-12 DIAGNOSIS — R0781 Pleurodynia: Secondary | ICD-10-CM

## 2012-11-12 DIAGNOSIS — J45909 Unspecified asthma, uncomplicated: Secondary | ICD-10-CM

## 2012-11-12 DIAGNOSIS — R059 Cough, unspecified: Secondary | ICD-10-CM

## 2012-11-12 DIAGNOSIS — J209 Acute bronchitis, unspecified: Secondary | ICD-10-CM

## 2012-11-12 DIAGNOSIS — R071 Chest pain on breathing: Secondary | ICD-10-CM

## 2012-11-12 MED ORDER — AZITHROMYCIN 250 MG PO TABS: ORAL_TABLET | ORAL | Status: DC

## 2012-11-12 MED ORDER — ALBUTEROL SULFATE (5 MG/ML) 0.5% IN NEBU
5.0000 mg | INHALATION_SOLUTION | Freq: Once | RESPIRATORY_TRACT | Status: AC
  Administered 2012-11-12: 5 mg via RESPIRATORY_TRACT

## 2012-11-12 MED ORDER — ALBUTEROL SULFATE (2.5 MG/3ML) 0.083% IN NEBU: 2.5000 mg | INHALATION_SOLUTION | Freq: Four times a day (QID) | RESPIRATORY_TRACT | Status: DC | PRN

## 2012-11-12 MED ORDER — CEFTRIAXONE SODIUM 1 G IJ SOLR
1.0000 g | Freq: Once | INTRAMUSCULAR | Status: AC
  Administered 2012-11-12: 1 g via INTRAMUSCULAR

## 2012-11-12 MED ORDER — METHYLPREDNISOLONE ACETATE 80 MG/ML IJ SUSP
120.0000 mg | Freq: Once | INTRAMUSCULAR | Status: AC
  Administered 2012-11-12: 120 mg via INTRAMUSCULAR

## 2012-11-12 MED ORDER — HYDROCOD POLST-CHLORPHEN POLST 10-8 MG/5ML PO LQCR: 5.0000 mL | Freq: Two times a day (BID) | ORAL | Status: DC | PRN

## 2012-11-12 NOTE — Progress Notes (Signed)
Subjective:    Patient ID: Jordan Haynes, female    DOB: 15-Sep-1955, 57 y.o.   MRN: [id number]  HPI  Jordan Haynes is here for acute visit.  8 days of  Cough mildly productive of yellow mucous.  No documented fever.  Has anterior chest pain when coughing only.  Began   with sore throat and "moved down to may chest"  Jordan Haynes reports a history of pneumonia in the past   No Known Allergies Past Medical History  Diagnosis Date  . Hyperlipidemia   . Hypertension   . Migraine   . Menopause    Past Surgical History  Procedure Laterality Date  . Cholecystectomy    . Tonsillectomy    . Foot surgery      bunion removal   History   Social History  . Marital Status: Married    Spouse Name: N/A    Number of Children: N/A  . Years of Education: N/A   Occupational History  . Not on file.   Social History Main Topics  . Smoking status: Never Smoker   . Smokeless tobacco: Never Used  . Alcohol Use: No  . Drug Use: No  . Sexual Activity: Yes    Birth Control/ Protection: Surgical   Other Topics Concern  . Not on file   Social History Narrative  . No narrative on file   Family History  Problem Relation Age of Onset  . Cancer Father   . Cancer Sister   . Cancer Paternal Grandfather    Patient Active Problem List   Diagnosis Date Noted  . Vaginal dryness, menopausal 01/13/2012  . Menopause 10/30/2011  . Menopausal hot flushes 10/30/2011  . History of migraine headaches 10/30/2011  . Colon polyps 10/30/2011  . FH: colon cancer 10/30/2011  . History of vitamin D deficiency 10/30/2011  . Essential hypertension, benign 10/29/2011  . Other and unspecified hyperlipidemia 10/29/2011   Current Outpatient Prescriptions on File Prior to Visit  Medication Sig Dispense Refill  . atorvastatin (LIPITOR) 10 MG tablet Take one tablet every other day  45 tablet  1  . estradiol-norethindrone (ACTIVELLA) 1-0.5 MG per tablet Take 1/2 tablet every other day  20 tablet  0  .  hydrochlorothiazide (HYDRODIURIL) 25 MG tablet Take 1 tablet (25 mg total) by mouth daily.  90 tablet  1  . potassium chloride SA (K-DUR,KLOR-CON) 20 MEQ tablet Take 1 tablet (20 mEq total) by mouth daily.  30 tablet  3   No current facility-administered medications on file prior to visit.       Review of Systems    see HPI Objective:   Physical Exam Peak flow 300  Physical Exam  Nursing note and vitals reviewed.  Constitutional: She is oriented to person, place, and time. She appears well-developed and well-nourished. She is cooperative.  HENT:  Head: Normocephalic and atraumatic.  Nose: Mucosal edema present.  Eyes: Conjunctivae and EOM are normal. Pupils are equal, round, and reactive to light.  Neck: Neck supple.  Cardiovascular: Regular rhythm, normal heart sounds, intact distal pulses and normal pulses. Exam reveals no gallop and no friction rub.  No murmur heard.  Pulmonary/Chest: She has exp wheezing. She has rhonchi. She has no rales.  Decreased breath sounds bilateral bases Neurological: She is alert and oriented to person, place, and time.  Skin: Skin is warm and dry. No abrasion, no bruising, no ecchymosis and no rash noted. No cyanosis. Nails show no clubbing.  Psychiatric: She has a normal mood  and affect. Her speech is normal and behavior is normal.            Assessment & Plan:  Asthmatic bronchitis:    Will give albuterol HHN and depomedrol 120 mg in office .  Rocephin 1 gm in office    Zpak RX given.  See me in 10 days and call if not better  Cough:  Tussionex q 12h prn    Pleuritic chest pain due to cough.   Call if not better

## 2012-11-12 NOTE — Patient Instructions (Signed)
See me in 10 days    Pick up antibiotic downstairs    Call if not better

## 2012-11-23 ENCOUNTER — Ambulatory Visit (INDEPENDENT_AMBULATORY_CARE_PROVIDER_SITE_OTHER): Payer: BC Managed Care – PPO | Admitting: Internal Medicine

## 2012-11-23 ENCOUNTER — Ambulatory Visit (HOSPITAL_BASED_OUTPATIENT_CLINIC_OR_DEPARTMENT_OTHER)
Admission: RE | Admit: 2012-11-23 | Discharge: 2012-11-23 | Disposition: A | Discharge status: Home / Self Care | Payer: BC Managed Care – PPO | Source: Ambulatory Visit | Attending: Internal Medicine | Admitting: Internal Medicine

## 2012-11-23 ENCOUNTER — Encounter: Payer: Self-pay | Admitting: Internal Medicine

## 2012-11-23 VITALS — BP 103/64 | HR 68 | Temp 98.3°F | Resp 18 | Wt 137.0 lb

## 2012-11-23 DIAGNOSIS — Z23 Encounter for immunization: Secondary | ICD-10-CM

## 2012-11-23 DIAGNOSIS — J45909 Unspecified asthma, uncomplicated: Secondary | ICD-10-CM

## 2012-11-23 DIAGNOSIS — R059 Cough, unspecified: Secondary | ICD-10-CM

## 2012-11-23 DIAGNOSIS — R05 Cough: Secondary | ICD-10-CM

## 2012-11-23 DIAGNOSIS — J45901 Unspecified asthma with (acute) exacerbation: Secondary | ICD-10-CM

## 2012-11-23 DIAGNOSIS — R0789 Other chest pain: Secondary | ICD-10-CM

## 2012-11-23 NOTE — Progress Notes (Signed)
  Subjective:    Patient ID: Jordan Haynes, female    DOB: [birth year], 56 y.o.   MRN: [id number]  HPI Lucciana  Is here  For follow up  Feeling "80% better "  But still has chest tightness  Cough dry now  No Known Allergies Past Medical History  Diagnosis Date  . Hyperlipidemia   . Hypertension   . Migraine   . Menopause    Past Surgical History  Procedure Laterality Date  . Cholecystectomy    . Tonsillectomy    . Foot surgery      bunion removal   History   Social History  . Marital Status: Married    Spouse Name: N/A    Number of Children: N/A  . Years of Education: N/A   Occupational History  . Not on file.   Social History Main Topics  . Smoking status: Never Smoker   . Smokeless tobacco: Never Used  . Alcohol Use: No  . Drug Use: No  . Sexual Activity: Yes    Birth Control/ Protection: Surgical   Other Topics Concern  . Not on file   Social History Narrative  . No narrative on file   Family History  Problem Relation Age of Onset  . Cancer Father   . Cancer Sister   . Cancer Paternal Grandfather    Patient Active Problem List   Diagnosis Date Noted  . Acute asthmatic bronchitis 11/12/2012  . Vaginal dryness, menopausal 01/13/2012  . Menopause 10/30/2011  . Menopausal hot flushes 10/30/2011  . History of migraine headaches 10/30/2011  . Colon polyps 10/30/2011  . FH: colon cancer 10/30/2011  . History of vitamin D deficiency 10/30/2011  . Essential hypertension, benign 10/29/2011  . Other and unspecified hyperlipidemia 10/29/2011   Current Outpatient Prescriptions on File Prior to Visit  Medication Sig Dispense Refill  . atorvastatin (LIPITOR) 10 MG tablet Take one tablet every other day  45 tablet  1  . chlorpheniramine-HYDROcodone (TUSSIONEX PENNKINETIC ER) 10-8 MG/5ML LQCR Take 5 mLs by mouth every 12 (twelve) hours as needed.  115 mL  0  . estradiol-norethindrone (ACTIVELLA) 1-0.5 MG per tablet Take 1/2 tablet every other day  20 tablet   0  . hydrochlorothiazide (HYDRODIURIL) 25 MG tablet Take 1 tablet (25 mg total) by mouth daily.  90 tablet  1  . potassium chloride SA (K-DUR,KLOR-CON) 20 MEQ tablet Take 1 tablet (20 mEq total) by mouth daily.  30 tablet  3   No current facility-administered medications on file prior to visit.       Review of Systems See HPI    Objective:   Physical Exam  Physical Exam  Nursing note and vitals reviewed.  Constitutional: She is oriented to person, place, and time. She appears well-developed and well-nourished.  HENT:  Head: Normocephalic and atraumatic.  Cardiovascular: Normal rate and regular rhythm. Exam reveals no gallop and no friction rub.  No murmur heard.  Pulmonary/Chest: Breath sounds normal. She has no wheezes. She has no rales.  Neurological: She is alert and oriented to person, place, and time.  Skin: Skin is warm and dry.  Psychiatric: She has a normal mood and affect. Her behavior is normal.             Assessment & Plan:  Chest tightness  EKB  NSr  Short PR  Will also get CXR today  Bronchitis  Continue meds  Cough  Medication prn

## 2012-11-23 NOTE — Patient Instructions (Signed)
See me if not improved

## 2012-11-24 ENCOUNTER — Telehealth: Payer: Self-pay | Admitting: *Deleted

## 2012-11-24 NOTE — Telephone Encounter (Signed)
Notified pt of x ray results  

## 2012-11-24 NOTE — Telephone Encounter (Signed)
Message copied by Mathews Robinsons on Tue Nov 24, 2012  8:50 AM ------      Message from: Raechel Chute D      Created: Mon Nov 23, 2012  3:07 PM       Karen Kitchens            Call pt and let her know that her CXR looks fine  No pneumonia  Call office if not better ------

## 2013-01-06 ENCOUNTER — Other Ambulatory Visit: Payer: Self-pay | Admitting: Internal Medicine

## 2013-01-11 NOTE — Telephone Encounter (Signed)
Refill request

## 2013-03-02 ENCOUNTER — Other Ambulatory Visit: Payer: Self-pay | Admitting: Internal Medicine

## 2013-03-03 ENCOUNTER — Other Ambulatory Visit: Payer: Self-pay | Admitting: *Deleted

## 2013-03-03 NOTE — Telephone Encounter (Signed)
Jordan Haynes  I was tapering [name]'s Activella to discontinue.  If she still has any , she can finish 1/2 pill qod then stop    I refused this refill

## 2013-03-03 NOTE — Telephone Encounter (Signed)
refilll request last MM 05/2012

## 2013-03-10 ENCOUNTER — Other Ambulatory Visit: Payer: Self-pay | Admitting: Internal Medicine

## 2013-03-10 NOTE — Telephone Encounter (Signed)
Refill request

## 2013-03-16 ENCOUNTER — Other Ambulatory Visit: Payer: Self-pay | Admitting: *Deleted

## 2013-03-16 NOTE — Telephone Encounter (Signed)
Pt states that

## 2013-03-16 NOTE — Telephone Encounter (Signed)
LVM message for pt to return call  

## 2013-05-14 DIAGNOSIS — L817 Pigmented purpuric dermatosis: Secondary | ICD-10-CM

## 2013-05-14 HISTORY — DX: Pigmented purpuric dermatosis: L81.7

## 2013-06-10 ENCOUNTER — Other Ambulatory Visit: Payer: Self-pay

## 2013-06-10 DIAGNOSIS — Z1231 Encounter for screening mammogram for malignant neoplasm of breast: Secondary | ICD-10-CM

## 2013-07-09 ENCOUNTER — Ambulatory Visit
Admission: RE | Admit: 2013-07-09 | Discharge: 2013-07-09 | Disposition: A | Discharge status: Home / Self Care | Payer: BC Managed Care – PPO | Source: Ambulatory Visit

## 2013-07-09 ENCOUNTER — Other Ambulatory Visit: Payer: Self-pay | Admitting: Internal Medicine

## 2013-07-09 DIAGNOSIS — Z1231 Encounter for screening mammogram for malignant neoplasm of breast: Secondary | ICD-10-CM

## 2013-10-25 ENCOUNTER — Other Ambulatory Visit: Payer: Self-pay | Admitting: *Deleted

## 2013-10-25 DIAGNOSIS — Z0000 Encounter for general adult medical examination without abnormal findings: Secondary | ICD-10-CM

## 2013-11-01 LAB — COMPLETE METABOLIC PANEL WITH GFR
ALT: 19 U/L (ref 0–35)
AST: 21 U/L (ref 0–37)
Albumin: 4.2 g/dL (ref 3.5–5.2)
Alkaline Phosphatase: 50 U/L (ref 39–117)
BUN: 8 mg/dL (ref 6–23)
CO2: 33 mEq/L — ABNORMAL HIGH (ref 19–32)
Calcium: 9.3 mg/dL (ref 8.4–10.5)
Chloride: 103 mEq/L (ref 96–112)
Creat: 0.63 mg/dL (ref 0.50–1.10)
GFR, Est African American: >89 mL/min
GFR, Est Non African American: >89 mL/min
Glucose, Bld: 82 mg/dL (ref 70–99)
Potassium: 3.3 mEq/L — ABNORMAL LOW (ref 3.5–5.3)
Sodium: 140 mEq/L (ref 135–145)
Total Bilirubin: 0.9 mg/dL (ref 0.2–1.2)
Total Protein: 6.4 g/dL (ref 6.0–8.3)

## 2013-11-01 LAB — CBC WITH DIFFERENTIAL/PLATELET
Basophils Absolute: 0 10*3/uL (ref 0.0–0.1)
Basophils Relative: 0 % (ref 0–1)
Eosinophils Absolute: 0.1 10*3/uL (ref 0.0–0.7)
Eosinophils Relative: 2 % (ref 0–5)
HCT: 41 % (ref 36.0–46.0)
Hemoglobin: 14 g/dL (ref 12.0–15.0)
Lymphocytes Relative: 31 % (ref 12–46)
Lymphs Abs: 1.6 10*3/uL (ref 0.7–4.0)
MCH: 28.6 pg (ref 26.0–34.0)
MCHC: 34.1 g/dL (ref 30.0–36.0)
MCV: 83.8 fL (ref 78.0–100.0)
Monocytes Absolute: 0.5 10*3/uL (ref 0.1–1.0)
Monocytes Relative: 10 % (ref 3–12)
Neutro Abs: 2.9 10*3/uL (ref 1.7–7.7)
Neutrophils Relative %: 57 % (ref 43–77)
Platelets: 216 10*3/uL (ref 150–400)
RBC: 4.89 MIL/uL (ref 3.87–5.11)
RDW: 13.3 % (ref 11.5–15.5)
WBC: 5 10*3/uL (ref 4.0–10.5)

## 2013-11-01 LAB — TSH: TSH: 3.186 u[IU]/mL (ref 0.350–4.500)

## 2013-11-01 LAB — LIPID PANEL
Cholesterol: 151 mg/dL (ref 0–200)
HDL: 61 mg/dL (ref >39)
LDL Cholesterol: 76 mg/dL (ref 0–99)
Total CHOL/HDL Ratio: 2.5 Ratio
Triglycerides: 72 mg/dL (ref <150)
VLDL: 14 mg/dL (ref 0–40)

## 2013-11-02 LAB — VITAMIN D 25 HYDROXY (VIT D DEFICIENCY, FRACTURES): Vit D, 25-Hydroxy: 26 ng/mL — ABNORMAL LOW (ref 30–89)

## 2013-11-08 NOTE — Progress Notes (Signed)
Jordan Haynes is aware of her lab results. She will take 2 tabs of  K+ for the next days-eh

## 2013-11-10 ENCOUNTER — Ambulatory Visit (INDEPENDENT_AMBULATORY_CARE_PROVIDER_SITE_OTHER): Payer: BC Managed Care – PPO | Admitting: Internal Medicine

## 2013-11-10 ENCOUNTER — Encounter: Payer: Self-pay | Admitting: Internal Medicine

## 2013-11-10 VITALS — BP 126/72 | HR 43 | Temp 98.2°F | Resp 16 | Ht 63.25 in | Wt 138.0 lb

## 2013-11-10 DIAGNOSIS — N951 Menopausal and female climacteric states: Secondary | ICD-10-CM

## 2013-11-10 DIAGNOSIS — Z800 Family history of malignant neoplasm of digestive organs: Secondary | ICD-10-CM

## 2013-11-10 DIAGNOSIS — Z0000 Encounter for general adult medical examination without abnormal findings: Secondary | ICD-10-CM

## 2013-11-10 DIAGNOSIS — I10 Essential (primary) hypertension: Secondary | ICD-10-CM

## 2013-11-10 DIAGNOSIS — E785 Hyperlipidemia, unspecified: Secondary | ICD-10-CM

## 2013-11-10 DIAGNOSIS — Z0001 Encounter for general adult medical examination with abnormal findings: Secondary | ICD-10-CM

## 2013-11-10 LAB — HEMOCCULT GUIAC POC 1CARD (OFFICE): Fecal Occult Blood, POC: NEGATIVE

## 2013-11-10 LAB — POCT URINALYSIS DIPSTICK
Bilirubin, UA: NEGATIVE
Glucose, UA: NEGATIVE
Ketones, UA: NEGATIVE
Leukocytes, UA: NEGATIVE
Nitrite, UA: NEGATIVE
Protein, UA: NEGATIVE
Spec Grav, UA: 1.015
Urobilinogen, UA: NEGATIVE
pH, UA: 6.5

## 2013-11-10 MED ORDER — FUROSEMIDE 20 MG PO TABS: 20.0000 mg | ORAL_TABLET | Freq: Every day | ORAL | Status: DC

## 2013-11-10 NOTE — Progress Notes (Signed)
Subjective:    Patient ID: Jordan Haynes, female    DOB: Jul 07, 1955, 58 y.o.   MRN: [id number]  HPI  11/2012 Chest tightness EKB NSr Short PR Will also get CXR today  Bronchitis Continue meds  Cough Medication prn   Today   Jordan Haynes is here for CPE  Hm:  [name] 06/2013, colonoscopy due 2018 ([place]  [facility] ), she is a non-smoker   Problem list and meds reviewed  Hyperlipidemia  See labs  Jordan Haynes had reduced her statin ot 3 times a week and labs look great.   LE edema  She does note bilateral LE edema that HCTZ has not controlled very well.  She denies excessive salt intake   No Known Allergies Past Medical History  Diagnosis Date  . Hyperlipidemia   . Hypertension   . Migraine   . Menopause    Past Surgical History  Procedure Laterality Date  . Cholecystectomy    . Tonsillectomy    . Foot surgery      bunion removal   History   Social History  . Marital Status: Married    Spouse Name: N/A    Number of Children: N/A  . Years of Education: N/A   Occupational History  . Not on file.   Social History Main Topics  . Smoking status: Never Smoker   . Smokeless tobacco: Never Used  . Alcohol Use: No  . Drug Use: No  . Sexual Activity: Yes    Birth Control/ Protection: Surgical   Other Topics Concern  . Not on file   Social History Narrative  . No narrative on file   Family History  Problem Relation Age of Onset  . Cancer Father   . Cancer Sister   . Cancer Paternal Grandfather    Patient Active Problem List   Diagnosis Date Noted  . Acute asthmatic bronchitis 11/12/2012  . Vaginal dryness, menopausal 01/13/2012  . Menopause 10/30/2011  . Menopausal hot flushes 10/30/2011  . History of migraine headaches 10/30/2011  . Colon polyps 10/30/2011  . FH: colon cancer 10/30/2011  . History of vitamin D deficiency 10/30/2011  . Essential hypertension, benign 10/29/2011  . Other and unspecified hyperlipidemia 10/29/2011   Current Outpatient  Prescriptions on File Prior to Visit  Medication Sig Dispense Refill  . atorvastatin (LIPITOR) 10 MG tablet Take one tablet every other day  45 tablet  1  . atorvastatin (LIPITOR) 10 MG tablet TAKE 1 TABLET BY MOUTH EVERY OTHER DAY  45 tablet  1  . chlorpheniramine-HYDROcodone (TUSSIONEX PENNKINETIC ER) 10-8 MG/5ML LQCR Take 5 mLs by mouth every 12 (twelve) hours as needed.  115 mL  0  . estradiol-norethindrone (ACTIVELLA) 1-0.5 MG per tablet Take 1/2 tablet every other day  20 tablet  0  . hydrochlorothiazide (HYDRODIURIL) 25 MG tablet TAKE 1 TABLET BY MOUTH DAILY.  90 tablet  PRN  . potassium chloride SA (K-DUR,KLOR-CON) 20 MEQ tablet TAKE 1 TABLET BY MOUTH DAILY.  30 tablet  11   No current facility-administered medications on file prior to visit.      Review of Systems See HPI    Objective:   Physical Exam       Physical Exam  Nursing note and vitals reviewed.  Constitutional: She is oriented to person, place, and time. She appears well-developed and well-nourished.  HENT:  Head: Normocephalic and atraumatic.  Right Ear: Tympanic membrane and ear canal normal. No drainage. Tympanic membrane is not injected and not  erythematous.  Left Ear: Tympanic membrane and ear canal normal. No drainage. Tympanic membrane is not injected and not erythematous.  Nose: Nose normal. Right sinus exhibits no maxillary sinus tenderness and no frontal sinus tenderness. Left sinus exhibits no maxillary sinus tenderness and no frontal sinus tenderness.  Mouth/Throat: Oropharynx is clear and moist. No oral lesions. No oropharyngeal exudate.  Eyes: Conjunctivae and EOM are normal. Pupils are equal, round, and reactive to light.  Neck: Normal range of motion. Neck supple. No JVD present. Carotid bruit is not present. No mass and no thyromegaly present.  Cardiovascular: Normal rate, regular rhythm, S1 normal, S2 normal and intact distal pulses. Exam reveals no gallop and no friction rub.  No murmur heard.    Pulses:  Carotid pulses are 2+ on the right side, and 2+ on the left side.  Dorsalis pedis pulses are 2+ on the right side, and 2+ on the left side.  No carotid bruit. No LE edema  Pulmonary/Chest: Breath sounds normal. She has no wheezes. She has no rales. She exhibits no tenderness.  Breast no discrete masses no nipple discharge no axillary adenopathy bilaterally Abdominal: Soft. Bowel sounds are normal. She exhibits no distension and no mass. There is no hepatosplenomegaly. There is no tenderness. There is no CVA tenderness. Rectal no mass guaiac neg Musculoskeletal: Normal range of motion.  No active synovitis to joints.  Lymphadenopathy:  She has no cervical adenopathy.  She has no axillary adenopathy.  Right: No inguinal and no supraclavicular adenopathy present.  Left: No inguinal and no supraclavicular adenopathy present.  Neurological: She is alert and oriented to person, place, and time. She has normal strength and normal reflexes. She displays no tremor. No cranial nerve deficit or sensory deficit. Coordination and gait normal.  Skin: Skin is warm and dry. No rash noted. No cyanosis. Nails show no clubbing.  Ext  1+ edema bilaterally  Psychiatric: She has a normal mood and affect. Her speech is normal and behavior is normal. Cognition and memory are normal.            Assessment & Plan:  HM:   Had flu vaccine.    Colonoscopy due 2018,  Pt is a non-smoker pap due 2017  UTD with vaccines  Declines Prevnar 13 now   Htn  Continue meds  LE edema  Ok to initiate lasix 20 mg daily stop HCTZ  Advised to be sure to take K daily.    Hyperlipidemia    On statin 3 times a week  Lipids are great   Menopausal hot flushes  She is off HT now  Hypokalemia  Minimal  Advised to be sure to take her K daily   See me in 4 weeks to check CMP and edema         Assessment & Plan:

## 2013-11-10 NOTE — Patient Instructions (Signed)
See me in 4 weeks 

## 2013-11-14 ENCOUNTER — Encounter: Payer: Self-pay | Admitting: Internal Medicine

## 2013-12-12 NOTE — Progress Notes (Signed)
Subjective:    Patient ID: Jordan Haynes, female    DOB: [birth year], 58 y.o.   MRN: [id number]  HPI  10/28 Assessment & Plan:  HM: Had flu vaccine. Colonoscopy due 2018, Pt is a non-smoker pap due 2017 UTD with vaccines Declines Prevnar 13 now   Htn Continue meds  LE edema Ok to initiate lasix 20 mg daily stop HCTZ Advised to be sure to take K daily.   Hyperlipidemia On statin 3 times a week Lipids are great   Menopausal hot flushes She is off HT now  Hypokalemia Minimal Advised to be sure to take her K daily   See me in 4 weeks to check CMP and edema     TODAY:  Edema markedly improved.  Taking Lasix with her K daily.   Vaginal dryness  Using Luvena and states having less yeast infections.   Needs refill   No Known Allergies Past Medical History  Diagnosis Date  . Hyperlipidemia   . Hypertension   . Migraine   . Menopause   . Schamberg's disease 05/2013   Past Surgical History  Procedure Laterality Date  . Cholecystectomy    . Tonsillectomy    . Foot surgery      bunion removal   History   Social History  . Marital Status: Married    Spouse Name: N/A    Number of Children: N/A  . Years of Education: N/A   Occupational History  . Not on file.   Social History Main Topics  . Smoking status: Never Smoker   . Smokeless tobacco: Never Used  . Alcohol Use: No  . Drug Use: No  . Sexual Activity: Yes    Birth Control/ Protection: Surgical   Other Topics Concern  . Not on file   Social History Narrative   Family History  Problem Relation Age of Onset  . Cancer Father   . Dementia Father   . Cancer Sister   . Cancer Paternal Grandfather   . Hypertension Mother   . Stroke Mother   . Hyperlipidemia Mother   . Dementia Mother    Patient Active Problem List   Diagnosis Date Noted  . Acute asthmatic bronchitis 11/12/2012  . Vaginal dryness, menopausal 01/13/2012  . Menopause 10/30/2011  . Menopausal hot flushes  10/30/2011  . History of migraine headaches 10/30/2011  . Colon polyps 10/30/2011  . FH: colon cancer 10/30/2011  . History of vitamin D deficiency 10/30/2011  . Essential hypertension, benign 10/29/2011  . Hyperlipidemia 10/29/2011   Current Outpatient Prescriptions on File Prior to Visit  Medication Sig Dispense Refill  . atorvastatin (LIPITOR) 10 MG tablet TAKE 1 TABLET BY MOUTH EVERY OTHER DAY 45 tablet 1  . furosemide (LASIX) 20 MG tablet Take 1 tablet (20 mg total) by mouth daily. 30 tablet 3  . potassium chloride SA (K-DUR,KLOR-CON) 20 MEQ tablet TAKE 1 TABLET BY MOUTH DAILY. 30 tablet 11   No current facility-administered medications on file prior to visit.      Review of Systems See HPI     Objective:   Physical Exam Physical Exam  Nursing note and vitals reviewed.  Constitutional: She is oriented to person, place, and time. She appears well-developed and well-nourished.  HENT:  Head: Normocephalic and atraumatic.  Cardiovascular: Normal rate and regular rhythm. Exam reveals no gallop and no friction rub.  No murmur heard.  Pulmonary/Chest: Breath sounds normal. She has no wheezes. She has no rales.  Neurological: She is  alert and oriented to person, place, and time.  Skin: Skin is warm and dry.  Psychiatric: She has a normal mood and affect. Her behavior is normal.          Assessment & Plan:  LE edema  Continue Lasix with K  Will check bmp today   Vaginal dryness  Continue Samul Dada   See me as needed

## 2013-12-13 ENCOUNTER — Ambulatory Visit (INDEPENDENT_AMBULATORY_CARE_PROVIDER_SITE_OTHER): Payer: BC Managed Care – PPO | Admitting: Internal Medicine

## 2013-12-13 ENCOUNTER — Encounter: Payer: Self-pay | Admitting: Internal Medicine

## 2013-12-13 VITALS — BP 112/67 | HR 66 | Temp 98.3°F | Resp 16 | Ht 63.0 in | Wt 138.0 lb

## 2013-12-13 DIAGNOSIS — E876 Hypokalemia: Secondary | ICD-10-CM | POA: Diagnosis not present

## 2013-12-13 DIAGNOSIS — R609 Edema, unspecified: Secondary | ICD-10-CM | POA: Diagnosis not present

## 2013-12-13 DIAGNOSIS — N898 Other specified noninflammatory disorders of vagina: Secondary | ICD-10-CM

## 2013-12-13 LAB — BASIC METABOLIC PANEL
BUN: 6 mg/dL (ref 6–23)
CO2: 29 mEq/L (ref 19–32)
Calcium: 9.6 mg/dL (ref 8.4–10.5)
Chloride: 103 mEq/L (ref 96–112)
Creat: 0.61 mg/dL (ref 0.50–1.10)
Glucose, Bld: 93 mg/dL (ref 70–99)
Potassium: 3.9 mEq/L (ref 3.5–5.3)
Sodium: 141 mEq/L (ref 135–145)

## 2013-12-13 MED ORDER — LUVENA PREBIOTIC LUBRICANT VA SOLN: VAGINAL | Status: AC

## 2013-12-13 NOTE — Patient Instructions (Signed)
See me as needed  To lab today

## 2014-01-31 ENCOUNTER — Other Ambulatory Visit: Payer: Self-pay | Admitting: Internal Medicine

## 2014-01-31 NOTE — Telephone Encounter (Signed)
Refill request

## 2014-04-11 ENCOUNTER — Other Ambulatory Visit: Payer: Self-pay | Admitting: Internal Medicine

## 2014-04-11 NOTE — Telephone Encounter (Signed)
Refill request

## 2014-06-28 ENCOUNTER — Other Ambulatory Visit: Payer: Self-pay

## 2014-06-28 DIAGNOSIS — Z1231 Encounter for screening mammogram for malignant neoplasm of breast: Secondary | ICD-10-CM

## 2014-07-20 ENCOUNTER — Ambulatory Visit
Admission: RE | Admit: 2014-07-20 | Discharge: 2014-07-20 | Disposition: A | Discharge status: Home / Self Care | Payer: BC Managed Care – PPO | Source: Ambulatory Visit

## 2014-07-20 DIAGNOSIS — Z1231 Encounter for screening mammogram for malignant neoplasm of breast: Secondary | ICD-10-CM

## 2014-07-28 ENCOUNTER — Ambulatory Visit (INDEPENDENT_AMBULATORY_CARE_PROVIDER_SITE_OTHER): Payer: BC Managed Care – PPO | Admitting: Internal Medicine

## 2014-07-28 ENCOUNTER — Encounter: Payer: Self-pay | Admitting: Internal Medicine

## 2014-07-28 VITALS — BP 118/70 | HR 83 | Resp 12 | Ht 63.0 in | Wt 136.0 lb

## 2014-07-28 DIAGNOSIS — G43109 Migraine with aura, not intractable, without status migrainosus: Secondary | ICD-10-CM | POA: Diagnosis not present

## 2014-07-28 DIAGNOSIS — I10 Essential (primary) hypertension: Secondary | ICD-10-CM | POA: Diagnosis not present

## 2014-07-28 DIAGNOSIS — E785 Hyperlipidemia, unspecified: Secondary | ICD-10-CM | POA: Diagnosis not present

## 2014-07-28 MED ORDER — FUROSEMIDE 20 MG PO TABS: ORAL_TABLET | ORAL | Status: DC

## 2014-07-28 MED ORDER — ATORVASTATIN CALCIUM 10 MG PO TABS: 10.0000 mg | ORAL_TABLET | Freq: Every day | ORAL | Status: DC

## 2014-07-28 MED ORDER — POTASSIUM CHLORIDE CRYS ER 20 MEQ PO TBCR: 20.0000 meq | EXTENDED_RELEASE_TABLET | Freq: Every day | ORAL | Status: DC

## 2014-07-28 MED ORDER — ELETRIPTAN HYDROBROMIDE 20 MG PO TABS: 20.0000 mg | ORAL_TABLET | ORAL | Status: DC | PRN

## 2014-07-28 NOTE — Assessment & Plan Note (Signed)
BP today well within normal and okay to trial off her blood pressure medication. She will monitor at home and if her average is >140 or >90 she will resume or call the office. Reviewed recent BMP which did not indicate changes needed at this time.

## 2014-07-28 NOTE — Progress Notes (Signed)
Pre visit review using our clinic review tool, if applicable. No additional management support is needed unless otherwise documented below in the visit note. 

## 2014-07-28 NOTE — Patient Instructions (Signed)
We do not need to check blood work today. We have sent in the relpax for the migraines. You can use 1 when you feel like you are getting migraine symptoms.   We have sent in the lasix and potassium but you can stop taking them to see how the blood pressure does. Using the average from 1 week we want that to be around 140/90. If it is consistently above 150 or above 95 call the office or start taking the lasix again.   Come back next year for a physical. Please feel free to call the office with problems or questions before then.   Migraine Headache A migraine headache is an intense, throbbing pain on one or both sides of your head. A migraine can last for 30 minutes to several hours. CAUSES  The exact cause of a migraine headache is not always known. However, a migraine may be caused when nerves in the brain become irritated and release chemicals that cause inflammation. This causes pain. Certain things may also trigger migraines, such as:  Alcohol.  Smoking.  Stress.  Menstruation.  Aged cheeses.  Foods or drinks that contain nitrates, glutamate, aspartame, or tyramine.  Lack of sleep.  Chocolate.  Caffeine.  Hunger.  Physical exertion.  Fatigue.  Medicines used to treat chest pain (nitroglycerine), birth control pills, estrogen, and some blood pressure medicines. SIGNS AND SYMPTOMS  Pain on one or both sides of your head.  Pulsating or throbbing pain.  Severe pain that prevents daily activities.  Pain that is aggravated by any physical activity.  Nausea, vomiting, or both.  Dizziness.  Pain with exposure to bright lights, loud noises, or activity.  General sensitivity to bright lights, loud noises, or smells. Before you get a migraine, you may get warning signs that a migraine is coming (aura). An aura may include:  Seeing flashing lights.  Seeing bright spots, halos, or zigzag lines.  Having tunnel vision or blurred vision.  Having feelings of numbness  or tingling.  Having trouble talking.  Having muscle weakness. DIAGNOSIS  A migraine headache is often diagnosed based on:  Symptoms.  Physical exam.  A CT scan or MRI of your head. These imaging tests cannot diagnose migraines, but they can help rule out other causes of headaches. TREATMENT Medicines may be given for pain and nausea. Medicines can also be given to help prevent recurrent migraines.  HOME CARE INSTRUCTIONS  Only take over-the-counter or prescription medicines for pain or discomfort as directed by your health care provider. The use of long-term narcotics is not recommended.  Lie down in a dark, quiet room when you have a migraine.  Keep a journal to find out what may trigger your migraine headaches. For example, write down:  What you eat and drink.  How much sleep you get.  Any change to your diet or medicines.  Limit alcohol consumption.  Quit smoking if you smoke.  Get 7-9 hours of sleep, or as recommended by your health care provider.  Limit stress.  Keep lights dim if bright lights bother you and make your migraines worse. SEEK IMMEDIATE MEDICAL CARE IF:   Your migraine becomes severe.  You have a fever.  You have a stiff neck.  You have vision loss.  You have muscular weakness or loss of muscle control.  You start losing your balance or have trouble walking.  You feel faint or pass out.  You have severe symptoms that are different from your first symptoms. MAKE SURE YOU:  Understand these instructions.  Will watch your condition.  Will get help right away if you are not doing well or get worse. Document Released: 12/31/2004 Document Revised: 05/17/2013 Document Reviewed: 09/07/2012 [facility] Patient Information 2015 [place], [place]. This information is not intended to replace advice given to you by your health care provider. Make sure you discuss any questions you have with your health care provider.

## 2014-07-28 NOTE — Assessment & Plan Note (Signed)
Rx for relpax for abortive therapy. She does have a good aura and opportunity for therapy.

## 2014-07-28 NOTE — Assessment & Plan Note (Signed)
Takes lipitor every other day and refill provided at today's visit. Reviewed recent lipid panel and at goal.

## 2014-07-28 NOTE — Progress Notes (Signed)
   Subjective:    Patient ID: Jordan Haynes, female    DOB: 1955-05-15, 59 y.o.   MRN: [id number]  HPI The patient is a 59 YO woman who is coming in new to discuss her blood pressure. She was diagnosed during a stressful time in her life and does not feel that she truly has high blood pressure. She has been taking lasix and potassium daily for the blood pressure and this is the first thing she was tried on. No side effects and no known complications. Would like to try off if she could safely. No headaches, chest pains, SOB. No nausea or vomiting.   PMH, [facility], social history reviewed and updated.   Review of Systems  Constitutional: Negative for fever, activity change, appetite change, fatigue and unexpected weight change.  HENT: Negative.   Eyes: Negative.   Respiratory: Negative for cough, chest tightness, shortness of breath and wheezing.   Cardiovascular: Negative for chest pain, palpitations and leg swelling.  Gastrointestinal: Negative for nausea, abdominal pain, diarrhea, constipation and abdominal distention.  Musculoskeletal: Negative.   Skin: Negative.   Neurological: Negative.   Psychiatric/Behavioral: Negative.       Objective:   Physical Exam  Constitutional: She is oriented to person, place, and time. She appears well-developed and well-nourished.  HENT:  Head: Normocephalic and atraumatic.  Eyes: EOM are normal.  Neck: Normal range of motion.  Cardiovascular: Normal rate and regular rhythm.   Pulmonary/Chest: Effort normal and breath sounds normal. No respiratory distress. She has no wheezes. She has no rales.  Abdominal: Soft. Bowel sounds are normal. She exhibits no distension. There is no tenderness. There is no rebound and no guarding.  Musculoskeletal: She exhibits no edema.  Neurological: She is alert and oriented to person, place, and time. Coordination normal.  Skin: Skin is warm and dry.  Psychiatric: She has a normal mood and affect.   Filed Vitals:   07/28/14 0945  BP: 118/70  Pulse: 83  Resp: 12  Height: 5\' 3"  (1.6 m)  Weight: 136 lb (61.689 kg)  SpO2: 97%      Assessment & Plan:

## 2014-08-04 ENCOUNTER — Ambulatory Visit: Payer: BC Managed Care – PPO | Admitting: Internal Medicine

## 2015-01-19 ENCOUNTER — Ambulatory Visit (INDEPENDENT_AMBULATORY_CARE_PROVIDER_SITE_OTHER)
Admission: RE | Admit: 2015-01-19 | Discharge: 2015-01-19 | Disposition: A | Discharge status: Home / Self Care | Payer: BC Managed Care – PPO | Source: Ambulatory Visit | Attending: Internal Medicine | Admitting: Internal Medicine

## 2015-01-19 ENCOUNTER — Encounter: Payer: Self-pay | Admitting: Internal Medicine

## 2015-01-19 ENCOUNTER — Ambulatory Visit (INDEPENDENT_AMBULATORY_CARE_PROVIDER_SITE_OTHER): Payer: BC Managed Care – PPO | Admitting: Internal Medicine

## 2015-01-19 VITALS — BP 128/74 | HR 83 | Temp 98.3°F | Ht 63.0 in | Wt 138.0 lb

## 2015-01-19 DIAGNOSIS — R062 Wheezing: Secondary | ICD-10-CM | POA: Diagnosis not present

## 2015-01-19 DIAGNOSIS — R05 Cough: Secondary | ICD-10-CM | POA: Diagnosis not present

## 2015-01-19 DIAGNOSIS — I10 Essential (primary) hypertension: Secondary | ICD-10-CM

## 2015-01-19 DIAGNOSIS — R059 Cough, unspecified: Secondary | ICD-10-CM

## 2015-01-19 MED ORDER — PREDNISONE 10 MG PO TABS: ORAL_TABLET | ORAL | Status: DC

## 2015-01-19 MED ORDER — ALBUTEROL SULFATE HFA 108 (90 BASE) MCG/ACT IN AERS
2.0000 | INHALATION_SPRAY | Freq: Four times a day (QID) | RESPIRATORY_TRACT | Status: DC | PRN
Start: 2015-01-19 — End: 2015-06-20

## 2015-01-19 MED ORDER — METHYLPREDNISOLONE ACETATE 40 MG/ML IJ SUSP
40.0000 mg | Freq: Once | INTRAMUSCULAR | Status: AC
  Administered 2015-01-19: 40 mg via INTRAMUSCULAR

## 2015-01-19 MED ORDER — HYDROCODONE-HOMATROPINE 5-1.5 MG/5ML PO SYRP: 5.0000 mL | ORAL_SOLUTION | Freq: Four times a day (QID) | ORAL | Status: DC | PRN

## 2015-01-19 MED ORDER — AZITHROMYCIN 250 MG PO TABS: ORAL_TABLET | ORAL | Status: DC

## 2015-01-19 MED ORDER — METHYLPREDNISOLONE ACETATE 80 MG/ML IJ SUSP
80.0000 mg | Freq: Once | INTRAMUSCULAR | Status: AC
  Administered 2015-01-19: 80 mg via INTRAMUSCULAR

## 2015-01-19 MED FILL — PROAIR HFA 90 MCG INHALER: 108 (90 BAS | 25 days supply | Qty: 9 | Fill #0 | Status: TO

## 2015-01-19 MED FILL — AZITHROMYCIN 250 MG TABLET: 250 | 5 days supply | Qty: 6 | Fill #0

## 2015-01-19 MED FILL — HYDROCODONE-HOMATROPINE SYR: 5-1.5 | 9 days supply | Qty: 180 | Fill #0

## 2015-01-19 MED FILL — predniSONE 10 MG TABS: 10 | 9 days supply | Qty: 18 | Fill #0

## 2015-01-19 NOTE — Assessment & Plan Note (Signed)
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

## 2015-01-19 NOTE — Assessment & Plan Note (Signed)
With hx of recent LLL infiltrate on cxr by report/CAP - ok to change antibx to zpack, cough med prn, check f/u cxr

## 2015-01-19 NOTE — Assessment & Plan Note (Signed)
stable overall by history and exam, recent data reviewed with pt, and pt to continue medical treatment as before,  to f/u any worsening symptoms or concerns BP Readings from Last 3 Encounters:  01/19/15 128/74  07/28/14 118/70  12/13/13 112/67

## 2015-01-19 NOTE — Progress Notes (Signed)
Subjective:    Patient ID: Jordan Haynes, female    DOB: [birth year], 60 y.o.   MRN: [id number]  HPI  Here to f/u recent LLL pna confirmed by cxr, seen at tx at [facility] near [facility] 8 days ago, seemed initially better with less cough and sob first few days, but sicne then worse with increased scant prod cough, wheezing, sob, and Pt denies chest pain, increased orthopnea, PND, increased LE swelling, palpitations, dizziness or syncope. Has had ongoing low grade temp, no high fevers or chills.  Pt denies wt loss, night sweats, loss of appetite, or other constitutional symptoms  Past Medical History  Diagnosis Date  . Hyperlipidemia   . Hypertension   . Migraine   . Menopause   . Schamberg's disease 05/2013   Past Surgical History  Procedure Laterality Date  . Cholecystectomy    . Tonsillectomy    . Foot surgery      bunion removal    reports that she has never smoked. She has never used smokeless tobacco. She reports that she does not drink alcohol or use illicit drugs. family history includes Cancer in her father, paternal grandfather, and sister; Dementia in her father and mother; Hyperlipidemia in her mother; Hypertension in her mother; Stroke in her mother. No Known Allergies Current Outpatient Prescriptions on File Prior to Visit  Medication Sig Dispense Refill  . atorvastatin (LIPITOR) 10 MG tablet Take 1 tablet (10 mg total) by mouth daily at 6 PM. 90 tablet 3  . eletriptan (RELPAX) 20 MG tablet Take 1 tablet (20 mg total) by mouth as needed for migraine or headache. May repeat in 2 hours if headache persists or recurs. 10 tablet 3  . furosemide (LASIX) 20 MG tablet Take one tablet daily with potssium 30 tablet 3  . potassium chloride SA (K-DUR,KLOR-CON) 20 MEQ tablet Take 1 tablet (20 mEq total) by mouth daily. 90 tablet 2  . Vaginal Moisturizer (LUVENA PREBIOTIC LUBRICANT) SOLN Use vaginally twice a week and with intercourse 39 mL 6   No current facility-administered  medications on file prior to visit.   Review of Systems  Constitutional: Negative for unusual diaphoresis or night sweats HENT: Negative for ringing in ear or discharge Eyes: Negative for double vision or worsening visual disturbance.  Respiratory: Negative for choking and stridor.   Gastrointestinal: Negative for vomiting or other signifcant bowel change Genitourinary: Negative for hematuria or change in urine volume.  Musculoskeletal: Negative for other MSK pain or swelling Skin: Negative for color change and worsening wound.  Neurological: Negative for tremors and numbness other than noted  Psychiatric/Behavioral: Negative for decreased concentration or agitation other than above       Objective:   Physical Exam BP 128/74 mmHg  Pulse 83  Temp(Src) 98.3 F (36.8 C) (Oral)  Ht 5\' 3"  (1.6 m)  Wt 138 lb (62.596 kg)  BMI 24.45 kg/m2  SpO2 97% VS noted, mild ill Constitutional: Pt appears in no significant distress HENT: Head: NCAT.  Right Ear: External ear normal.  Left Ear: External ear normal. Bilat tm's with mild erythema.  Max sinus areas mild tender.  Pharynx with mild erythema, no exudate Eyes: . Pupils are equal, round, and reactive to light. Conjunctivae and EOM are normal Neck: Normal range of motion. Neck supple.  Cardiovascular: Normal rate and regular rhythm.   Pulmonary/Chest: Effort normal and breath sounds decreased without rales but has diffuse bilat rhonchi and wheezing.  Neurological: Pt is alert. Not confused , motor  grossly intact Skin: Skin is warm. No rash, no LE edema Psychiatric: Pt behavior is normal. No agitation.     Assessment & Plan:

## 2015-01-19 NOTE — Progress Notes (Signed)
Pre visit review using our clinic review tool, if applicable. No additional management support is needed unless otherwise documented below in the visit note. 

## 2015-01-19 NOTE — Addendum Note (Signed)
Addended by: Lyman Bishop on: 01/19/2015 03:35 PM   Modules accepted: Orders

## 2015-01-19 NOTE — Patient Instructions (Signed)
You had the steroid shot today  Please take all new medication as prescribed - the zpack, prednisone, cough medicine if needed, and inhaler if needed  Please continue all other medications as before, and refills have been done if requested.  Please have the pharmacy call with any other refills you may need.  Please keep your appointments with your specialists as you may have planned  Please go to the XRAY Department in the Basement (go straight as you get off the elevator) for the x-ray testing  You will be contacted by phone if any changes need to be made immediately.  Otherwise, you will receive a letter about your results with an explanation, but please check with MyChart first.  Please remember to sign up for MyChart if you have not done so, as this will be important to you in the future with finding out test results, communicating by private email, and scheduling acute appointments online when needed.

## 2015-01-20 ENCOUNTER — Ambulatory Visit: Payer: Self-pay | Admitting: Internal Medicine

## 2015-01-25 ENCOUNTER — Ambulatory Visit (INDEPENDENT_AMBULATORY_CARE_PROVIDER_SITE_OTHER): Payer: BC Managed Care – PPO | Admitting: Internal Medicine

## 2015-01-25 ENCOUNTER — Encounter: Payer: Self-pay | Admitting: Internal Medicine

## 2015-01-25 VITALS — BP 130/74 | HR 71 | Temp 97.7°F | Resp 16 | Ht 63.5 in | Wt 138.4 lb

## 2015-01-25 DIAGNOSIS — J189 Pneumonia, unspecified organism: Secondary | ICD-10-CM

## 2015-01-25 DIAGNOSIS — R05 Cough: Secondary | ICD-10-CM

## 2015-01-25 DIAGNOSIS — Z0000 Encounter for general adult medical examination without abnormal findings: Secondary | ICD-10-CM

## 2015-01-25 DIAGNOSIS — J181 Lobar pneumonia, unspecified organism: Secondary | ICD-10-CM

## 2015-01-25 DIAGNOSIS — R059 Cough, unspecified: Secondary | ICD-10-CM

## 2015-01-25 MED ORDER — FLUTICASONE PROPIONATE 50 MCG/ACT NA SUSP: 2.0000 | Freq: Every day | NASAL | Status: DC

## 2015-01-25 MED FILL — FLUTICASONE PROP 50 MCG SPR: 50 | 30 days supply | Qty: 16 | Fill #0

## 2015-01-25 NOTE — Assessment & Plan Note (Signed)
Ordered repeat chest x-ray for clearance in 3-4 weeks. Also ordered screening labs to be drawn same time. She will finish prednisone and rx for flonase for her mild congestion. Overall improving LLL pneumonia.

## 2015-01-25 NOTE — Progress Notes (Signed)
   Subjective:    Patient ID: Jordan Haynes, female    DOB: [birth year], 60 y.o.   MRN: [id number]  HPI The patient is a 60 YO female coming in for follow up of her pneumonia. She was seen over christmas at urgent care and given an antibiotic which did not help. She then was seen here and given a different antibiotic and prednisone which has helped more. She still has mild cough but SOB is better. Some sinus congestion. No fevers or chills.   Review of Systems  Constitutional: Positive for activity change and fatigue. Negative for fever, chills, appetite change and unexpected weight change.  HENT: Positive for congestion and rhinorrhea. Negative for ear discharge, ear pain, postnasal drip, sinus pressure, sore throat and trouble swallowing.   Eyes: Negative.   Respiratory: Positive for cough. Negative for chest tightness, shortness of breath and wheezing.   Cardiovascular: Negative for chest pain, palpitations and leg swelling.  Gastrointestinal: Negative.   Musculoskeletal: Negative.       Objective:   Physical Exam  Constitutional: She is oriented to person, place, and time. She appears well-developed and well-nourished.  HENT:  Head: Normocephalic and atraumatic.  Oropharynx mild erythema  Eyes: EOM are normal.  Neck: Normal range of motion.  Cardiovascular: Normal rate and regular rhythm.   Pulmonary/Chest: No respiratory distress. She has no wheezes. She has no rales.  Mild coarse sounds left base, mild and clear with coughing, otherwise clear.   Abdominal: Soft. She exhibits no distension. There is no tenderness.  Neurological: She is alert and oriented to person, place, and time. Coordination normal.  Skin: Skin is warm and dry.   Filed Vitals:   01/25/15 0933  BP: 130/74  Pulse: 71  Temp: 97.7 F (36.5 C)  TempSrc: Oral  Resp: 16  Height: 5' 3.5" (1.613 m)  Weight: 138 lb 6.4 oz (62.778 kg)  SpO2: 98%      Assessment & Plan:

## 2015-01-25 NOTE — Patient Instructions (Signed)
Keep taking the medicine until it is gone.   I think that you are mending and the lungs sound better today.   Come back in about 3-4 weeks for the chest x-ray. We have also put in the wellness labs so you can come in the morning for both.   We have sent in flonase which is a nose spray. Use 2 sprays in each nostril once a day for 1-2 weeks to help with the drainage.

## 2015-01-25 NOTE — Progress Notes (Signed)
Pre visit review using our clinic review tool, if applicable. No additional management support is needed unless otherwise documented below in the visit note. 

## 2015-02-06 MED FILL — RELPAX 20 MG TABLET: 20 | 30 days supply | Qty: 10 | Fill #1

## 2015-02-07 ENCOUNTER — Other Ambulatory Visit: Payer: Self-pay

## 2015-02-07 MED ORDER — FUROSEMIDE 20 MG PO TABS: ORAL_TABLET | ORAL | Status: DC

## 2015-02-07 MED FILL — FUROSEMIDE 20 MG TABLET: 20 | 30 days supply | Qty: 30 | Fill #0

## 2015-02-07 MED FILL — PROAIR HFA 90 MCG INHALER: 108 (90 BAS | 25 days supply | Qty: 9 | Fill #0

## 2015-02-14 ENCOUNTER — Ambulatory Visit (INDEPENDENT_AMBULATORY_CARE_PROVIDER_SITE_OTHER)
Admission: RE | Admit: 2015-02-14 | Discharge: 2015-02-14 | Disposition: A | Discharge status: Home / Self Care | Payer: BC Managed Care – PPO | Source: Ambulatory Visit | Attending: Internal Medicine | Admitting: Internal Medicine

## 2015-02-14 ENCOUNTER — Other Ambulatory Visit (INDEPENDENT_AMBULATORY_CARE_PROVIDER_SITE_OTHER): Payer: BC Managed Care – PPO

## 2015-02-14 DIAGNOSIS — J189 Pneumonia, unspecified organism: Secondary | ICD-10-CM | POA: Diagnosis not present

## 2015-02-14 DIAGNOSIS — Z0000 Encounter for general adult medical examination without abnormal findings: Secondary | ICD-10-CM | POA: Diagnosis not present

## 2015-02-14 DIAGNOSIS — J181 Lobar pneumonia, unspecified organism: Principal | ICD-10-CM

## 2015-02-14 LAB — LIPID PANEL
Cholesterol: 189 mg/dL (ref 0–200)
HDL: 71.9 mg/dL (ref >39.00)
LDL Cholesterol: 102 mg/dL — ABNORMAL HIGH (ref 0–99)
NonHDL: 116.61
Total CHOL/HDL Ratio: 3
Triglycerides: 72 mg/dL (ref 0.0–149.0)
VLDL: 14.4 mg/dL (ref 0.0–40.0)

## 2015-02-14 LAB — COMPREHENSIVE METABOLIC PANEL
ALT: 11 U/L (ref 0–35)
AST: 16 U/L (ref 0–37)
Albumin: 4.6 g/dL (ref 3.5–5.2)
Alkaline Phosphatase: 66 U/L (ref 39–117)
BUN: 7 mg/dL (ref 6–23)
CO2: 30 mEq/L (ref 19–32)
Calcium: 10.1 mg/dL (ref 8.4–10.5)
Chloride: 104 mEq/L (ref 96–112)
Creatinine, Ser: 0.74 mg/dL (ref 0.40–1.20)
GFR: 85.47 mL/min (ref >60.00)
Glucose, Bld: 88 mg/dL (ref 70–99)
Potassium: 3.8 mEq/L (ref 3.5–5.1)
Sodium: 143 mEq/L (ref 135–145)
Total Bilirubin: 0.9 mg/dL (ref 0.2–1.2)
Total Protein: 7.4 g/dL (ref 6.0–8.3)

## 2015-02-14 LAB — TSH: TSH: 2.98 u[IU]/mL (ref 0.35–4.50)

## 2015-02-14 LAB — VITAMIN D 25 HYDROXY (VIT D DEFICIENCY, FRACTURES): VITD: 71.51 ng/mL (ref 30.00–100.00)

## 2015-02-20 ENCOUNTER — Ambulatory Visit (INDEPENDENT_AMBULATORY_CARE_PROVIDER_SITE_OTHER): Payer: BC Managed Care – PPO | Admitting: Internal Medicine

## 2015-02-20 ENCOUNTER — Encounter: Payer: Self-pay | Admitting: Internal Medicine

## 2015-02-20 MED ORDER — FUROSEMIDE 20 MG PO TABS: ORAL_TABLET | ORAL | Status: DC

## 2015-02-20 NOTE — Progress Notes (Signed)
Pre visit review using our clinic review tool, if applicable. No additional management support is needed unless otherwise documented below in the visit note. 

## 2015-03-22 MED FILL — FUROSEMIDE 20 MG TABLET: 20 | 30 days supply | Qty: 30 | Fill #1

## 2015-04-07 MED FILL — RELPAX 20 MG TABLET: 20 | 30 days supply | Qty: 10 | Fill #2

## 2015-04-18 MED FILL — KLOR-CON M20 TABLET: 20 | 90 days supply | Qty: 90 | Fill #1

## 2015-04-18 MED FILL — FUROSEMIDE 20 MG TABLET: 20 | 30 days supply | Qty: 30 | Fill #2

## 2015-05-30 MED FILL — FUROSEMIDE 20 MG TABLET: 20 | 30 days supply | Qty: 30 | Fill #3

## 2015-06-20 ENCOUNTER — Ambulatory Visit (INDEPENDENT_AMBULATORY_CARE_PROVIDER_SITE_OTHER): Payer: BC Managed Care – PPO | Admitting: Internal Medicine

## 2015-06-20 ENCOUNTER — Encounter: Payer: Self-pay | Admitting: Internal Medicine

## 2015-06-20 VITALS — BP 120/70 | HR 42 | Temp 97.6°F | Ht 63.0 in | Wt 139.8 lb

## 2015-06-20 DIAGNOSIS — Z0000 Encounter for general adult medical examination without abnormal findings: Secondary | ICD-10-CM

## 2015-06-20 DIAGNOSIS — I10 Essential (primary) hypertension: Secondary | ICD-10-CM | POA: Diagnosis not present

## 2015-06-20 DIAGNOSIS — N951 Menopausal and female climacteric states: Secondary | ICD-10-CM | POA: Diagnosis not present

## 2015-06-20 DIAGNOSIS — E785 Hyperlipidemia, unspecified: Secondary | ICD-10-CM | POA: Diagnosis not present

## 2015-06-20 MED ORDER — DIAZEPAM 5 MG PO TABS: ORAL_TABLET | ORAL | Status: DC

## 2015-06-20 MED FILL — diazePAM 5 MG TABS: 5 | 60 days supply | Qty: 60 | Fill #0

## 2015-06-20 NOTE — Assessment & Plan Note (Signed)
At goal on lipitor 10 mg daily. Reviewed last lipid panel with her.

## 2015-06-20 NOTE — Progress Notes (Signed)
   Subjective:    Patient ID: Jordan Haynes, female    DOB: 1955/12/27, 60 y.o.   MRN: [id number]  HPI The patient is a 60 YO female coming in for wellness. Trying to add more activity into her day but not doing much formal exercise. Non-smoker.   PMH, [employer], social history reviewed and updated.   Review of Systems  Constitutional: Negative for fever, chills, activity change, appetite change, fatigue and unexpected weight change.  HENT: Negative for congestion, ear discharge, ear pain, postnasal drip, rhinorrhea, sinus pressure, sore throat and trouble swallowing.   Eyes: Negative.   Respiratory: Negative for cough, chest tightness, shortness of breath and wheezing.   Cardiovascular: Negative for chest pain, palpitations and leg swelling.  Gastrointestinal: Negative.   Musculoskeletal: Negative.   Skin: Negative.   Neurological: Negative.   Psychiatric/Behavioral: Negative.       Objective:   Physical Exam  Constitutional: She is oriented to person, place, and time. She appears well-developed and well-nourished.  HENT:  Head: Normocephalic and atraumatic.  Eyes: EOM are normal.  Neck: Normal range of motion.  Cardiovascular: Normal rate and regular rhythm.   Pulmonary/Chest: Effort normal and breath sounds normal. No respiratory distress. She has no wheezes. She has no rales.  Abdominal: Soft. Bowel sounds are normal. She exhibits no distension. There is no tenderness. There is no rebound.  Musculoskeletal: She exhibits no edema.  Neurological: She is alert and oriented to person, place, and time. Coordination normal.  Skin: Skin is warm and dry.  Psychiatric: She has a normal mood and affect.   Filed Vitals:   06/20/15 0809  BP: 120/70  Pulse: 42  Temp: 97.6 F (36.4 C)  TempSrc: Oral  Height: 5\' 3"  (1.6 m)  Weight: 139 lb 12 oz (63.39 kg)  SpO2: 98%      Assessment & Plan:

## 2015-06-20 NOTE — Assessment & Plan Note (Signed)
Tried going off lasix but BP increased. At goal on lasix 20 mg daily. Offered to switch but no intolerable side effects and wishes to remain.

## 2015-06-20 NOTE — Progress Notes (Signed)
Pre visit review using our clinic review tool, if applicable. No additional management support is needed unless otherwise documented below in the visit note. 

## 2015-06-20 NOTE — Assessment & Plan Note (Signed)
More pervasive than just menopausal and she relates problems going back to teens with painful intercourse and low sex drive. Rx for valium to use vaginally to help with muscle tone as secondary option since vaginal estrogen creams have not helped and she is using maximum lubricants without relief.

## 2015-06-20 NOTE — Assessment & Plan Note (Signed)
Colonoscopy due next year, labs reviewed with patient from previous visit, counseled on the need for exercise and decreasing calories from beverages. Immunizations are up to date as well as pap and mammogram. Given screening recommendations.

## 2015-06-20 NOTE — Patient Instructions (Signed)
We have given you the prescription for the valium which you can insert vaginally 20-30 minutes before intercourse to see if this helps.   Keep up the good work with staying active.   Health Maintenance, Female Adopting a healthy lifestyle and getting preventive care can go a long way to promote health and wellness. Talk with your health care provider about what schedule of regular examinations is right for you. This is a good chance for you to check in with your provider about disease prevention and staying healthy. In between checkups, there are plenty of things you can do on your own. Experts have done a lot of research about which lifestyle changes and preventive measures are most likely to keep you healthy. Ask your health care provider for more information. WEIGHT AND DIET  Eat a healthy diet  Be sure to include plenty of vegetables, fruits, low-fat dairy products, and lean protein.  Do not eat a lot of foods high in solid fats, added sugars, or salt.  Get regular exercise. This is one of the most important things you can do for your health.  Most adults should exercise for at least 150 minutes each week. The exercise should increase your heart rate and make you sweat (moderate-intensity exercise).  Most adults should also do strengthening exercises at least twice a week. This is in addition to the moderate-intensity exercise.  Maintain a healthy weight  Body mass index (BMI) is a measurement that can be used to identify possible weight problems. It estimates body fat based on height and weight. Your health care provider can help determine your BMI and help you achieve or maintain a healthy weight.  For females 40 years of age and older:   A BMI below 18.5 is considered underweight.  A BMI of 18.5 to 24.9 is normal.  A BMI of 25 to 29.9 is considered overweight.  A BMI of 30 and above is considered obese.  Watch levels of cholesterol and blood lipids  You should start having  your blood tested for lipids and cholesterol at 59 years of age, then have this test every 5 years.  You may need to have your cholesterol levels checked more often if:  Your lipid or cholesterol levels are high.  You are older than 59 years of age.  You are at high risk for heart disease.  CANCER SCREENING   Lung Cancer  Lung cancer screening is recommended for adults 71-47 years old who are at high risk for lung cancer because of a history of smoking.  A yearly low-dose CT scan of the lungs is recommended for people who:  Currently smoke.  Have quit within the past 15 years.  Have at least a 30-pack-year history of smoking. A pack year is smoking an average of one pack of cigarettes a day for 1 year.  Yearly screening should continue until it has been 15 years since you quit.  Yearly screening should stop if you develop a health problem that would prevent you from having lung cancer treatment.  Breast Cancer  Practice breast self-awareness. This means understanding how your breasts normally appear and feel.  It also means doing regular breast self-exams. Let your health care provider know about any changes, no matter how small.  If you are in your 20s or 30s, you should have a clinical breast exam (CBE) by a health care provider every 1-3 years as part of a regular health exam.  If you are 40 or older, have  a CBE every year. Also consider having a breast X-ray (mammogram) every year.  If you have a family history of breast cancer, talk to your health care provider about genetic screening.  If you are at high risk for breast cancer, talk to your health care provider about having an MRI and a mammogram every year.  Breast cancer gene (BRCA) assessment is recommended for women who have family members with BRCA-related cancers. BRCA-related cancers include:  Breast.  Ovarian.  Tubal.  Peritoneal cancers.  Results of the assessment will determine the need for genetic  counseling and BRCA1 and BRCA2 testing. Cervical Cancer Your health care provider may recommend that you be screened regularly for cancer of the pelvic organs (ovaries, uterus, and vagina). This screening involves a pelvic examination, including checking for microscopic changes to the surface of your cervix (Pap test). You may be encouraged to have this screening done every 3 years, beginning at age 21.  For women ages 30-65, health care providers may recommend pelvic exams and Pap testing every 3 years, or they may recommend the Pap and pelvic exam, combined with testing for human papilloma virus (HPV), every 5 years. Some types of HPV increase your risk of cervical cancer. Testing for HPV may also be done on women of any age with unclear Pap test results.  Other health care providers may not recommend any screening for nonpregnant women who are considered low risk for pelvic cancer and who do not have symptoms. Ask your health care provider if a screening pelvic exam is right for you.  If you have had past treatment for cervical cancer or a condition that could lead to cancer, you need Pap tests and screening for cancer for at least 20 years after your treatment. If Pap tests have been discontinued, your risk factors (such as having a new sexual partner) need to be reassessed to determine if screening should resume. Some women have medical problems that increase the chance of getting cervical cancer. In these cases, your health care provider may recommend more frequent screening and Pap tests. Colorectal Cancer  This type of cancer can be detected and often prevented.  Routine colorectal cancer screening usually begins at 59 years of age and continues through 59 years of age.  Your health care provider may recommend screening at an earlier age if you have risk factors for colon cancer.  Your health care provider may also recommend using home test kits to check for hidden blood in the stool.  A  small camera at the end of a tube can be used to examine your colon directly (sigmoidoscopy or colonoscopy). This is done to check for the earliest forms of colorectal cancer.  Routine screening usually begins at age 50.  Direct examination of the colon should be repeated every 5-10 years through 59 years of age. However, you may need to be screened more often if early forms of precancerous polyps or small growths are found. Skin Cancer  Check your skin from head to toe regularly.  Tell your health care provider about any new moles or changes in moles, especially if there is a change in a mole's shape or color.  Also tell your health care provider if you have a mole that is larger than the size of a pencil eraser.  Always use sunscreen. Apply sunscreen liberally and repeatedly throughout the day.  Protect yourself by wearing long sleeves, pants, a wide-brimmed hat, and sunglasses whenever you are outside. HEART DISEASE, DIABETES, AND HIGH   BLOOD PRESSURE   High blood pressure causes heart disease and increases the risk of stroke. High blood pressure is more likely to develop in:  People who have blood pressure in the high end of the normal range (130-139/85-89 mm Hg).  People who are overweight or obese.  People who are African American.  If you are 18-13 years of age, have your blood pressure checked every 3-5 years. If you are 57 years of age or older, have your blood pressure checked every year. You should have your blood pressure measured twice--once when you are at a hospital or clinic, and once when you are not at a hospital or clinic. Record the average of the two measurements. To check your blood pressure when you are not at a hospital or clinic, you can use:  An automated blood pressure machine at a pharmacy.  A home blood pressure monitor.  If you are between 12 years and 71 years old, ask your health care provider if you should take aspirin to prevent strokes.  Have  regular diabetes screenings. This involves taking a blood sample to check your fasting blood sugar level.  If you are at a normal weight and have a low risk for diabetes, have this test once every three years after 59 years of age.  If you are overweight and have a high risk for diabetes, consider being tested at a younger age or more often. PREVENTING INFECTION  Hepatitis B  If you have a higher risk for hepatitis B, you should be screened for this virus. You are considered at high risk for hepatitis B if:  You were born in a country where hepatitis B is common. Ask your health care provider which countries are considered high risk.  Your parents were born in a high-risk country, and you have not been immunized against hepatitis B (hepatitis B vaccine).  You have HIV or AIDS.  You use needles to inject street drugs.  You live with someone who has hepatitis B.  You have had sex with someone who has hepatitis B.  You get hemodialysis treatment.  You take certain medicines for conditions, including cancer, organ transplantation, and autoimmune conditions. Hepatitis C  Blood testing is recommended for:  Everyone born from [birth year] through 1964.  Anyone with known risk factors for hepatitis C. Sexually transmitted infections (STIs)  You should be screened for sexually transmitted infections (STIs) including gonorrhea and chlamydia if:  You are sexually active and are younger than 59 years of age.  You are older than 59 years of age and your health care provider tells you that you are at risk for this type of infection.  Your sexual activity has changed since you were last screened and you are at an increased risk for chlamydia or gonorrhea. Ask your health care provider if you are at risk.  If you do not have HIV, but are at risk, it may be recommended that you take a prescription medicine daily to prevent HIV infection. This is called pre-exposure prophylaxis (PrEP). You are  considered at risk if:  You are sexually active and do not regularly use condoms or know the HIV status of your partner(s).  You take drugs by injection.  You are sexually active with a partner who has HIV. Talk with your health care provider about whether you are at high risk of being infected with HIV. If you choose to begin PrEP, you should first be tested for HIV. You should then be tested every  3 months for as long as you are taking PrEP.  PREGNANCY   If you are premenopausal and you may become pregnant, ask your health care provider about preconception counseling.  If you may become pregnant, take 400 to 800 micrograms (mcg) of folic acid every day.  If you want to prevent pregnancy, talk to your health care provider about birth control (contraception). OSTEOPOROSIS AND MENOPAUSE   Osteoporosis is a disease in which the bones lose minerals and strength with aging. This can result in serious bone fractures. Your risk for osteoporosis can be identified using a bone density scan.  If you are 65 years of age or older, or if you are at risk for osteoporosis and fractures, ask your health care provider if you should be screened.  Ask your health care provider whether you should take a calcium or vitamin D supplement to lower your risk for osteoporosis.  Menopause may have certain physical symptoms and risks.  Hormone replacement therapy may reduce some of these symptoms and risks. Talk to your health care provider about whether hormone replacement therapy is right for you.  HOME CARE INSTRUCTIONS   Schedule regular health, dental, and eye exams.  Stay current with your immunizations.   Do not use any tobacco products including cigarettes, chewing tobacco, or electronic cigarettes.  If you are pregnant, do not drink alcohol.  If you are breastfeeding, limit how much and how often you drink alcohol.  Limit alcohol intake to no more than 1 drink per day for nonpregnant women. One  drink equals 12 ounces of beer, 5 ounces of wine, or 1 ounces of hard liquor.  Do not use street drugs.  Do not share needles.  Ask your health care provider for help if you need support or information about quitting drugs.  Tell your health care provider if you often feel depressed.  Tell your health care provider if you have ever been abused or do not feel safe at home.   This information is not intended to replace advice given to you by your health care provider. Make sure you discuss any questions you have with your health care provider.   Document Released: 07/16/2010 Document Revised: 01/21/2014 Document Reviewed: 12/02/2012 Elsevier Interactive Patient Education 2016 [employer]  

## 2015-07-07 MED FILL — FUROSEMIDE 20 MG TABLET: 20 | 90 days supply | Qty: 90 | Fill #0

## 2015-07-11 ENCOUNTER — Other Ambulatory Visit: Payer: Self-pay | Admitting: Internal Medicine

## 2015-07-11 DIAGNOSIS — Z1231 Encounter for screening mammogram for malignant neoplasm of breast: Secondary | ICD-10-CM

## 2015-07-20 MED FILL — RELPAX 20 MG TABLET: 20 | 30 days supply | Qty: 10 | Fill #3

## 2015-07-25 ENCOUNTER — Ambulatory Visit
Admission: RE | Admit: 2015-07-25 | Discharge: 2015-07-25 | Disposition: A | Discharge status: Home / Self Care | Payer: BC Managed Care – PPO | Source: Ambulatory Visit | Attending: Internal Medicine | Admitting: Internal Medicine

## 2015-07-25 DIAGNOSIS — Z1231 Encounter for screening mammogram for malignant neoplasm of breast: Secondary | ICD-10-CM

## 2015-07-28 MED FILL — KLOR-CON M20 TABLET: 20 | 90 days supply | Qty: 90 | Fill #2

## 2015-08-01 ENCOUNTER — Other Ambulatory Visit: Payer: Self-pay | Admitting: Internal Medicine

## 2015-08-01 MED FILL — ATORVASTATIN 10 MG TABLET: 10 | 90 days supply | Qty: 90 | Fill #0 | Status: TO

## 2015-10-17 MED FILL — FUROSEMIDE 20 MG TABLET: 20 | 90 days supply | Qty: 90 | Fill #1 | Status: TO

## 2015-11-01 ENCOUNTER — Ambulatory Visit (INDEPENDENT_AMBULATORY_CARE_PROVIDER_SITE_OTHER): Payer: BC Managed Care – PPO | Admitting: Internal Medicine

## 2015-11-01 ENCOUNTER — Encounter: Payer: Self-pay | Admitting: Internal Medicine

## 2015-11-01 DIAGNOSIS — N951 Menopausal and female climacteric states: Secondary | ICD-10-CM | POA: Diagnosis not present

## 2015-11-01 MED ORDER — ESTRADIOL 0.1 MG/GM VA CREA: 1.0000 | TOPICAL_CREAM | Freq: Every day | VAGINAL | 12 refills | Status: DC

## 2015-11-01 MED FILL — ESTRACE 0.01% CREAM: 0.1 | 10 days supply | Qty: 43 | Fill #0 | Status: TO

## 2015-11-01 NOTE — Assessment & Plan Note (Signed)
Rx for estrace to help with dryness and she can keep using the moisturizer if needed. Valium vaginal for the dyspareunia.

## 2015-11-01 NOTE — Progress Notes (Signed)
Pre visit review using our clinic review tool, if applicable. No additional management support is needed unless otherwise documented below in the visit note. 

## 2015-11-01 NOTE — Progress Notes (Signed)
   Subjective:    Patient ID: Jordan Haynes, female    DOB: [birth year], 60 y.o.   MRN: [id number]  HPI The patient is a 60 YO female coming in for vaginal dryness. She has struggled with it for some time and has some moisturizer she uses otc. She has been using that with limited success. She also has some muscle clenching prior to intercourse and was prescribed diazepam to use vaginally but she is not able to due to the severe dryness it does not dissolve. She is having constant discomfort from the dryness with moving and clothes. She is having mild hot flashes but not severe and she does not feel she needs treatment for those. No fevers or chills. No new sexual partners. No vaginal discharge or sores. No concerns for STDs.   Review of Systems  Constitutional: Negative for activity change, appetite change, fatigue, fever and unexpected weight change.  Respiratory: Negative.   Cardiovascular: Negative.   Gastrointestinal: Negative.   Genitourinary: Positive for dyspareunia and vaginal pain. Negative for decreased urine volume, difficulty urinating, dysuria, enuresis, pelvic pain, urgency, vaginal bleeding and vaginal discharge.       Vaginal dryness  Musculoskeletal: Negative.   Skin: Negative.       Objective:   Physical Exam  Constitutional: She is oriented to person, place, and time. She appears well-developed and well-nourished.  HENT:  Head: Normocephalic and atraumatic.  Eyes: EOM are normal.  Neck: Normal range of motion.  Cardiovascular: Normal rate and regular rhythm.   Pulmonary/Chest: Effort normal and breath sounds normal. No respiratory distress. She has no wheezes. She has no rales.  Abdominal: Soft.  Genitourinary:  Genitourinary Comments: Declines vaginal exam  Musculoskeletal: She exhibits no edema.  Neurological: She is alert and oriented to person, place, and time.   Vitals:   11/01/15 1054  BP: 140/90  Pulse: 65  Resp: 14  Temp: 98.3 F (36.8 C)    TempSrc: Oral  SpO2: 99%  Weight: 138 lb (62.6 kg)  Height: 5\' 3"  (1.6 m)      Assessment & Plan:

## 2015-11-01 NOTE — Patient Instructions (Signed)
We have sent in the prescription for the estrogen cream.   Use an applicator full daily for the next 2 weeks.   Then go to 5 days a week for 2 weeks.   Then go to 4 days per week for 2 weeks.   Then go to 3 days per week for 2 weeks.  If you can go down to 2 days per week.   If the dryness worsens go back to taking at the dose which was helpful.   The medicine does not go into the blood stream so does not carry the same risks as taking estrogen pills.

## 2015-11-16 ENCOUNTER — Other Ambulatory Visit: Payer: Self-pay | Admitting: Internal Medicine

## 2015-11-16 MED FILL — KLOR-CON M20 TABLET: 20 | 90 days supply | Qty: 90 | Fill #0 | Status: TO

## 2015-11-30 MED FILL — ESTRACE 0.01% CREAM: 0.1 | 10 days supply | Qty: 43 | Fill #1

## 2016-02-06 ENCOUNTER — Encounter: Payer: Self-pay | Admitting: Internal Medicine

## 2016-02-06 ENCOUNTER — Ambulatory Visit (INDEPENDENT_AMBULATORY_CARE_PROVIDER_SITE_OTHER): Payer: BC Managed Care – PPO | Admitting: Internal Medicine

## 2016-02-06 ENCOUNTER — Other Ambulatory Visit: Payer: BC Managed Care – PPO

## 2016-02-06 DIAGNOSIS — Z202 Contact with and (suspected) exposure to infections with a predominantly sexual mode of transmission: Secondary | ICD-10-CM

## 2016-02-06 LAB — HIV ANTIBODY (ROUTINE TESTING W REFLEX): HIV 1&2 Ab, 4th Generation: NONREACTIVE

## 2016-02-06 MED ORDER — ELETRIPTAN HYDROBROMIDE 20 MG PO TABS: 20.0000 mg | ORAL_TABLET | ORAL | 3 refills | Status: DC | PRN

## 2016-02-06 NOTE — Patient Instructions (Signed)
We will check for the STDs.   It is okay to take 1/2 pill of the valium orally for the anxiety during the day.

## 2016-02-06 NOTE — Assessment & Plan Note (Signed)
Checking GC/Chlamydia and HIV and RPR today to rule out infection. She is reminded about the need to use protection until testing returns.

## 2016-02-06 NOTE — Progress Notes (Signed)
   Subjective:    Patient ID: Jordan Haynes, female    DOB: 1955-06-06, 61 y.o.   MRN: [id number]  HPI The patient is a 61 YO female coming in for cheating husband and she is worried about exposure to STDs. She is not having any symptoms but is concerned. She is not sure when this started and was possibly exposed to STDs. She does have ongoing vaginal dryness issues which are not changed recently.   Review of Systems  Constitutional: Negative.   Respiratory: Negative.   Cardiovascular: Negative.   Gastrointestinal: Negative.   Genitourinary: Negative.   Musculoskeletal: Negative.   Skin: Negative.       Objective:   Physical Exam  Constitutional: She is oriented to person, place, and time. She appears well-developed and well-nourished.  HENT:  Head: Normocephalic and atraumatic.  Cardiovascular: Normal rate and regular rhythm.   Pulmonary/Chest: Effort normal and breath sounds normal.  Abdominal: Soft. She exhibits no distension. There is no tenderness.  Musculoskeletal: She exhibits no edema.  Neurological: She is alert and oriented to person, place, and time.  Skin: Skin is warm and dry.   Vitals:   02/06/16 1429  BP: (!) 150/70  Pulse: (!) 46  Resp: 12  Temp: 98.1 F (36.7 C)  TempSrc: Oral  SpO2: 99%  Weight: 136 lb (61.7 kg)  Height: 5\' 3"  (1.6 m)      Assessment & Plan:

## 2016-02-06 NOTE — Progress Notes (Signed)
Pre visit review using our clinic review tool, if applicable. No additional management support is needed unless otherwise documented below in the visit note. 

## 2016-02-07 LAB — GC/CHLAMYDIA PROBE AMP
CT Probe RNA: NOT DETECTED
GC Probe RNA: NOT DETECTED

## 2016-02-07 LAB — RPR

## 2016-05-17 ENCOUNTER — Other Ambulatory Visit (INDEPENDENT_AMBULATORY_CARE_PROVIDER_SITE_OTHER): Payer: BC Managed Care – PPO

## 2016-05-17 ENCOUNTER — Encounter: Payer: Self-pay | Admitting: Internal Medicine

## 2016-05-17 ENCOUNTER — Ambulatory Visit (INDEPENDENT_AMBULATORY_CARE_PROVIDER_SITE_OTHER): Payer: BC Managed Care – PPO | Admitting: Internal Medicine

## 2016-05-17 VITALS — BP 142/70 | HR 96 | Temp 99.0°F | Resp 12 | Ht 63.0 in | Wt 140.0 lb

## 2016-05-17 DIAGNOSIS — M7989 Other specified soft tissue disorders: Secondary | ICD-10-CM

## 2016-05-17 LAB — COMPREHENSIVE METABOLIC PANEL
ALT: 11 U/L (ref 0–35)
AST: 15 U/L (ref 0–37)
Albumin: 4.3 g/dL (ref 3.5–5.2)
Alkaline Phosphatase: 50 U/L (ref 39–117)
BUN: 11 mg/dL (ref 6–23)
CO2: 29 mEq/L (ref 19–32)
Calcium: 9.2 mg/dL (ref 8.4–10.5)
Chloride: 102 mEq/L (ref 96–112)
Creatinine, Ser: 0.64 mg/dL (ref 0.40–1.20)
GFR: 100.63 mL/min (ref >60.00)
Glucose, Bld: 85 mg/dL (ref 70–99)
Potassium: 3.5 mEq/L (ref 3.5–5.1)
Sodium: 138 mEq/L (ref 135–145)
Total Bilirubin: 0.5 mg/dL (ref 0.2–1.2)
Total Protein: 6.6 g/dL (ref 6.0–8.3)

## 2016-05-17 LAB — BRAIN NATRIURETIC PEPTIDE: Pro B Natriuretic peptide (BNP): 131 pg/mL — ABNORMAL HIGH (ref 0.0–100.0)

## 2016-05-17 LAB — CBC
HCT: 33.8 % — ABNORMAL LOW (ref 36.0–46.0)
Hemoglobin: 11.2 g/dL — ABNORMAL LOW (ref 12.0–15.0)
MCHC: 33.1 g/dL (ref 30.0–36.0)
MCV: 81 fl (ref 78.0–100.0)
Platelets: 180 10*3/uL (ref 150.0–400.0)
RBC: 4.17 Mil/uL (ref 3.87–5.11)
RDW: 14.2 % (ref 11.5–15.5)
WBC: 6.4 10*3/uL (ref 4.0–10.5)

## 2016-05-17 NOTE — Progress Notes (Signed)
   Subjective:    Patient ID: Jordan Haynes, female    DOB: 1955/03/26, 61 y.o.   MRN: [id number]  HPI The patient is a 61 YO female coming in for swelling in her legs. She did have a stress fracture in her foot several months ago and is still in a cloth brace. The swelling started about 1 week ago. Worse throughout the day. Goes down with elevation or in the morning. No leg pain or calf swelling, mostly in the low ankle. No fevers or chills. She is not inactive and ambulating normally. No change to diet or salt intake. Denies SOB or chest pains.   Review of Systems  Constitutional: Negative for activity change, appetite change, fatigue, fever and unexpected weight change.  Respiratory: Negative.   Cardiovascular: Positive for leg swelling. Negative for chest pain and palpitations.  Gastrointestinal: Negative.   Musculoskeletal: Negative.   Skin: Negative.   Neurological: Negative.       Objective:   Physical Exam  Constitutional: She is oriented to person, place, and time. She appears well-developed and well-nourished.  HENT:  Head: Normocephalic and atraumatic.  Eyes: EOM are normal.  Neck: Normal range of motion.  Cardiovascular: Normal rate and regular rhythm.   Pulmonary/Chest: Effort normal and breath sounds normal.  Abdominal: Soft.  Musculoskeletal: She exhibits edema.  Trace to 1+ non-pitting edema in the ankles bilaterally, no calf swelling or tenderness  Neurological: She is alert and oriented to person, place, and time. Coordination normal.  Skin: Skin is warm and dry. No rash noted.    Vitals:   05/17/16 1503  BP: (!) 142/70  Pulse: 96  Resp: 12  Temp: 99 F (37.2 C)  TempSrc: Oral  SpO2: 99%  Weight: 140 lb (63.5 kg)  Height: 5\' 3"  (1.6 m)      Assessment & Plan:

## 2016-05-17 NOTE — Progress Notes (Signed)
Pre visit review using our clinic review tool, if applicable. No additional management support is needed unless otherwise documented below in the visit note. 

## 2016-05-17 NOTE — Patient Instructions (Signed)
We will check the labs today and send you the results.    

## 2016-05-19 DIAGNOSIS — M7989 Other specified soft tissue disorders: Secondary | ICD-10-CM | POA: Insufficient documentation

## 2016-05-19 NOTE — Assessment & Plan Note (Signed)
Checking CMP and BNP to rule out alternate etiology. Likely from venous insufficiency. Mild and can use her compression stockings if needed. Low salt diet recommended and elevate legs as able.

## 2016-05-22 ENCOUNTER — Other Ambulatory Visit: Payer: Self-pay | Admitting: Internal Medicine

## 2016-07-02 ENCOUNTER — Other Ambulatory Visit: Payer: Self-pay | Admitting: Internal Medicine

## 2016-07-02 DIAGNOSIS — Z1231 Encounter for screening mammogram for malignant neoplasm of breast: Secondary | ICD-10-CM

## 2016-07-25 ENCOUNTER — Ambulatory Visit
Admission: RE | Admit: 2016-07-25 | Discharge: 2016-07-25 | Disposition: A | Discharge status: Home / Self Care | Payer: BC Managed Care – PPO | Source: Ambulatory Visit | Attending: Internal Medicine | Admitting: Internal Medicine

## 2016-07-25 DIAGNOSIS — Z1231 Encounter for screening mammogram for malignant neoplasm of breast: Secondary | ICD-10-CM

## 2016-08-17 ENCOUNTER — Other Ambulatory Visit: Payer: Self-pay | Admitting: Internal Medicine

## 2016-08-30 ENCOUNTER — Other Ambulatory Visit: Payer: Self-pay | Admitting: Internal Medicine

## 2016-10-07 ENCOUNTER — Encounter: Payer: Self-pay | Admitting: Internal Medicine

## 2016-10-07 ENCOUNTER — Other Ambulatory Visit (INDEPENDENT_AMBULATORY_CARE_PROVIDER_SITE_OTHER): Payer: BC Managed Care – PPO

## 2016-10-07 ENCOUNTER — Other Ambulatory Visit: Payer: BC Managed Care – PPO

## 2016-10-07 ENCOUNTER — Other Ambulatory Visit (HOSPITAL_COMMUNITY)
Admission: RE | Admit: 2016-10-07 | Discharge: 2016-10-07 | Disposition: A | Discharge status: Home / Self Care | Payer: BC Managed Care – PPO | Source: Ambulatory Visit | Attending: Internal Medicine | Admitting: Internal Medicine

## 2016-10-07 ENCOUNTER — Ambulatory Visit (INDEPENDENT_AMBULATORY_CARE_PROVIDER_SITE_OTHER): Payer: BC Managed Care – PPO | Admitting: Internal Medicine

## 2016-10-07 VITALS — BP 132/80 | HR 79 | Temp 97.5°F | Ht 63.0 in | Wt 136.0 lb

## 2016-10-07 DIAGNOSIS — Z23 Encounter for immunization: Secondary | ICD-10-CM

## 2016-10-07 DIAGNOSIS — Z124 Encounter for screening for malignant neoplasm of cervix: Secondary | ICD-10-CM

## 2016-10-07 DIAGNOSIS — I10 Essential (primary) hypertension: Secondary | ICD-10-CM | POA: Diagnosis not present

## 2016-10-07 DIAGNOSIS — Z1151 Encounter for screening for human papillomavirus (HPV): Secondary | ICD-10-CM | POA: Diagnosis not present

## 2016-10-07 DIAGNOSIS — Z0000 Encounter for general adult medical examination without abnormal findings: Secondary | ICD-10-CM

## 2016-10-07 DIAGNOSIS — E785 Hyperlipidemia, unspecified: Secondary | ICD-10-CM

## 2016-10-07 LAB — COMPREHENSIVE METABOLIC PANEL
ALT: 18 U/L (ref 0–35)
AST: 24 U/L (ref 0–37)
Albumin: 4.3 g/dL (ref 3.5–5.2)
Alkaline Phosphatase: 66 U/L (ref 39–117)
BUN: 8 mg/dL (ref 6–23)
CO2: 31 mEq/L (ref 19–32)
Calcium: 9.5 mg/dL (ref 8.4–10.5)
Chloride: 105 mEq/L (ref 96–112)
Creatinine, Ser: 0.76 mg/dL (ref 0.40–1.20)
GFR: 82.42 mL/min (ref >60.00)
Glucose, Bld: 75 mg/dL (ref 70–99)
Potassium: 4.3 mEq/L (ref 3.5–5.1)
Sodium: 145 mEq/L (ref 135–145)
Total Bilirubin: 0.9 mg/dL (ref 0.2–1.2)
Total Protein: 6.9 g/dL (ref 6.0–8.3)

## 2016-10-07 LAB — CBC
HCT: 40.8 % (ref 36.0–46.0)
Hemoglobin: 13.1 g/dL (ref 12.0–15.0)
MCHC: 32.2 g/dL (ref 30.0–36.0)
MCV: 79.6 fl (ref 78.0–100.0)
Platelets: 216 10*3/uL (ref 150.0–400.0)
RBC: 5.12 Mil/uL — ABNORMAL HIGH (ref 3.87–5.11)
RDW: 18.6 % — ABNORMAL HIGH (ref 11.5–15.5)
WBC: 6.2 10*3/uL (ref 4.0–10.5)

## 2016-10-07 LAB — TSH: TSH: 2.69 u[IU]/mL (ref 0.35–4.50)

## 2016-10-07 LAB — LIPID PANEL
Cholesterol: 174 mg/dL (ref 0–200)
HDL: 60.6 mg/dL (ref >39.00)
LDL Cholesterol: 93 mg/dL (ref 0–99)
NonHDL: 113.16
Total CHOL/HDL Ratio: 3
Triglycerides: 99 mg/dL (ref 0.0–149.0)
VLDL: 19.8 mg/dL (ref 0.0–40.0)

## 2016-10-07 MED ORDER — POTASSIUM CHLORIDE CRYS ER 20 MEQ PO TBCR: 20.0000 meq | EXTENDED_RELEASE_TABLET | Freq: Every day | ORAL | 11 refills | Status: DC

## 2016-10-07 MED ORDER — ESTRADIOL 0.1 MG/GM VA CREA: 1.0000 | TOPICAL_CREAM | Freq: Every day | VAGINAL | 12 refills | Status: DC

## 2016-10-07 MED ORDER — ELETRIPTAN HYDROBROMIDE 20 MG PO TABS: 20.0000 mg | ORAL_TABLET | ORAL | 6 refills | Status: DC | PRN

## 2016-10-07 MED ORDER — ATORVASTATIN CALCIUM 10 MG PO TABS: ORAL_TABLET | ORAL | 3 refills | Status: DC

## 2016-10-07 MED ORDER — FUROSEMIDE 20 MG PO TABS: ORAL_TABLET | ORAL | 3 refills | Status: DC

## 2016-10-07 NOTE — Assessment & Plan Note (Signed)
BP at goal on lasix daily. Checking CMP and adjust as needed.  

## 2016-10-07 NOTE — Progress Notes (Signed)
   Subjective:    Patient ID: Jordan Haynes, female    DOB: [birth year], 61 y.o.   MRN: [id number]  HPI The patient is a 61 YO female coming in for wellness. No new concerns. Needs pap smear.   PMH, [facility], social history reviewed and updated.   Review of Systems  Constitutional: Negative.   HENT: Negative.   Eyes: Negative.   Respiratory: Negative for cough, chest tightness and shortness of breath.   Cardiovascular: Negative for chest pain, palpitations and leg swelling.  Gastrointestinal: Negative for abdominal distention, abdominal pain, constipation, diarrhea, nausea and vomiting.  Musculoskeletal: Negative.   Skin: Negative.   Neurological: Negative.   Psychiatric/Behavioral: Negative.       Objective:   Physical Exam  Constitutional: She is oriented to person, place, and time. She appears well-developed and well-nourished.  HENT:  Head: Normocephalic and atraumatic.  Eyes: EOM are normal.  Neck: Normal range of motion.  Cardiovascular: Normal rate and regular rhythm.   Pulmonary/Chest: Effort normal and breath sounds normal. No respiratory distress. She has no wheezes. She has no rales.  Abdominal: Soft. Bowel sounds are normal. She exhibits no distension. There is no tenderness. There is no rebound.  Genitourinary: Vagina normal and uterus normal. No vaginal discharge found.  Genitourinary Comments: Pap smear sent  Musculoskeletal: She exhibits no edema.  Neurological: She is alert and oriented to person, place, and time. Coordination normal.  Skin: Skin is warm and dry.  Psychiatric: She has a normal mood and affect.   Vitals:   10/07/16 0856  BP: 132/80  Pulse: 79  Temp: (!) 97.5 F (36.4 C)  TempSrc: Oral  SpO2: 99%  Weight: 136 lb (61.7 kg)  Height: 5\' 3"  (1.6 m)      Assessment & Plan:  Flu shot given at visit.

## 2016-10-07 NOTE — Assessment & Plan Note (Signed)
Checking lipid panel and adjust lipitor as needed for goal LDL <130.

## 2016-10-07 NOTE — Assessment & Plan Note (Signed)
Pap smear collected, colonoscopy up to date. Flu shot given at visit. Needs dexa which is ordered. Tdap up to date. Mammogram up to date. Given screening recommendations. Counseled about sun safety and mole surveillance.

## 2016-10-07 NOTE — Patient Instructions (Addendum)
We will send you the results of the blood work and pap smear.   We will get the bone density ordered so you will get a call about that.   Health Maintenance, Female Adopting a healthy lifestyle and getting preventive care can go a long way to promote health and wellness. Talk with your health care provider about what schedule of regular examinations is right for you. This is a good chance for you to check in with your provider about disease prevention and staying healthy. In between checkups, there are plenty of things you can do on your own. Experts have done a lot of research about which lifestyle changes and preventive measures are most likely to keep you healthy. Ask your health care provider for more information. Weight and diet Eat a healthy diet  Be sure to include plenty of vegetables, fruits, low-fat dairy products, and lean protein.  Do not eat a lot of foods high in solid fats, added sugars, or salt.  Get regular exercise. This is one of the most important things you can do for your health. ? Most adults should exercise for at least 150 minutes each week. The exercise should increase your heart rate and make you sweat (moderate-intensity exercise). ? Most adults should also do strengthening exercises at least twice a week. This is in addition to the moderate-intensity exercise.  Maintain a healthy weight  Body mass index (BMI) is a measurement that can be used to identify possible weight problems. It estimates body fat based on height and weight. Your health care provider can help determine your BMI and help you achieve or maintain a healthy weight.  For females 28 years of age and older: ? A BMI below 18.5 is considered underweight. ? A BMI of 18.5 to 24.9 is normal. ? A BMI of 25 to 29.9 is considered overweight. ? A BMI of 30 and above is considered obese.  Watch levels of cholesterol and blood lipids  You should start having your blood tested for lipids and cholesterol at  60 years of age, then have this test every 5 years.  You may need to have your cholesterol levels checked more often if: ? Your lipid or cholesterol levels are high. ? You are older than 60 years of age. ? You are at high risk for heart disease.  Cancer screening Lung Cancer  Lung cancer screening is recommended for adults 22-66 years old who are at high risk for lung cancer because of a history of smoking.  A yearly low-dose CT scan of the lungs is recommended for people who: ? Currently smoke. ? Have quit within the past 15 years. ? Have at least a 30-pack-year history of smoking. A pack year is smoking an average of one pack of cigarettes a day for 1 year.  Yearly screening should continue until it has been 15 years since you quit.  Yearly screening should stop if you develop a health problem that would prevent you from having lung cancer treatment.  Breast Cancer  Practice breast self-awareness. This means understanding how your breasts normally appear and feel.  It also means doing regular breast self-exams. Let your health care provider know about any changes, no matter how small.  If you are in your 20s or 30s, you should have a clinical breast exam (CBE) by a health care provider every 1-3 years as part of a regular health exam.  If you are 69 or older, have a CBE every year. Also consider having  a breast X-ray (mammogram) every year.  If you have a family history of breast cancer, talk to your health care provider about genetic screening.  If you are at high risk for breast cancer, talk to your health care provider about having an MRI and a mammogram every year.  Breast cancer gene (BRCA) assessment is recommended for women who have family members with BRCA-related cancers. BRCA-related cancers include: ? Breast. ? Ovarian. ? Tubal. ? Peritoneal cancers.  Results of the assessment will determine the need for genetic counseling and BRCA1 and BRCA2 testing.  Cervical  Cancer Your health care provider may recommend that you be screened regularly for cancer of the pelvic organs (ovaries, uterus, and vagina). This screening involves a pelvic examination, including checking for microscopic changes to the surface of your cervix (Pap test). You may be encouraged to have this screening done every 3 years, beginning at age 33.  For women ages 13-65, health care providers may recommend pelvic exams and Pap testing every 3 years, or they may recommend the Pap and pelvic exam, combined with testing for human papilloma virus (HPV), every 5 years. Some types of HPV increase your risk of cervical cancer. Testing for HPV may also be done on women of any age with unclear Pap test results.  Other health care providers may not recommend any screening for nonpregnant women who are considered low risk for pelvic cancer and who do not have symptoms. Ask your health care provider if a screening pelvic exam is right for you.  If you have had past treatment for cervical cancer or a condition that could lead to cancer, you need Pap tests and screening for cancer for at least 20 years after your treatment. If Pap tests have been discontinued, your risk factors (such as having a new sexual partner) need to be reassessed to determine if screening should resume. Some women have medical problems that increase the chance of getting cervical cancer. In these cases, your health care provider may recommend more frequent screening and Pap tests.  Colorectal Cancer  This type of cancer can be detected and often prevented.  Routine colorectal cancer screening usually begins at 60 years of age and continues through 60 years of age.  Your health care provider may recommend screening at an earlier age if you have risk factors for colon cancer.  Your health care provider may also recommend using home test kits to check for hidden blood in the stool.  A small camera at the end of a tube can be used to  examine your colon directly (sigmoidoscopy or colonoscopy). This is done to check for the earliest forms of colorectal cancer.  Routine screening usually begins at age 69.  Direct examination of the colon should be repeated every 5-10 years through 60 years of age. However, you may need to be screened more often if early forms of precancerous polyps or small growths are found.  Skin Cancer  Check your skin from head to toe regularly.  Tell your health care provider about any new moles or changes in moles, especially if there is a change in a mole's shape or color.  Also tell your health care provider if you have a mole that is larger than the size of a pencil eraser.  Always use sunscreen. Apply sunscreen liberally and repeatedly throughout the day.  Protect yourself by wearing long sleeves, pants, a wide-brimmed hat, and sunglasses whenever you are outside.  Heart disease, diabetes, and high blood pressure  High blood pressure causes heart disease and increases the risk of stroke. High blood pressure is more likely to develop in: ? People who have blood pressure in the high end of the normal range (130-139/85-89 mm Hg). ? People who are overweight or obese. ? People who are African American.  If you are 10-41 years of age, have your blood pressure checked every 3-5 years. If you are 41 years of age or older, have your blood pressure checked every year. You should have your blood pressure measured twice-once when you are at a hospital or clinic, and once when you are not at a hospital or clinic. Record the average of the two measurements. To check your blood pressure when you are not at a hospital or clinic, you can use: ? An automated blood pressure machine at a pharmacy. ? A home blood pressure monitor.  If you are between 37 years and 65 years old, ask your health care provider if you should take aspirin to prevent strokes.  Have regular diabetes screenings. This involves taking a  blood sample to check your fasting blood sugar level. ? If you are at a normal weight and have a low risk for diabetes, have this test once every three years after 60 years of age. ? If you are overweight and have a high risk for diabetes, consider being tested at a younger age or more often. Preventing infection Hepatitis B  If you have a higher risk for hepatitis B, you should be screened for this virus. You are considered at high risk for hepatitis B if: ? You were born in a country where hepatitis B is common. Ask your health care provider which countries are considered high risk. ? Your parents were born in a high-risk country, and you have not been immunized against hepatitis B (hepatitis B vaccine). ? You have HIV or AIDS. ? You use needles to inject street drugs. ? You live with someone who has hepatitis B. ? You have had sex with someone who has hepatitis B. ? You get hemodialysis treatment. ? You take certain medicines for conditions, including cancer, organ transplantation, and autoimmune conditions.  Hepatitis C  Blood testing is recommended for: ? Everyone born from [birth year] through 1964. ? Anyone with known risk factors for hepatitis C.  Sexually transmitted infections (STIs)  You should be screened for sexually transmitted infections (STIs) including gonorrhea and chlamydia if: ? You are sexually active and are younger than 60 years of age. ? You are older than 60 years of age and your health care provider tells you that you are at risk for this type of infection. ? Your sexual activity has changed since you were last screened and you are at an increased risk for chlamydia or gonorrhea. Ask your health care provider if you are at risk.  If you do not have HIV, but are at risk, it may be recommended that you take a prescription medicine daily to prevent HIV infection. This is called pre-exposure prophylaxis (PrEP). You are considered at risk if: ? You are sexually active and  do not regularly use condoms or know the HIV status of your partner(s). ? You take drugs by injection. ? You are sexually active with a partner who has HIV.  Talk with your health care provider about whether you are at high risk of being infected with HIV. If you choose to begin PrEP, you should first be tested for HIV. You should then be tested every 3 months  for as long as you are taking PrEP. Pregnancy  If you are premenopausal and you may become pregnant, ask your health care provider about preconception counseling.  If you may become pregnant, take 400 to 800 micrograms (mcg) of folic acid every day.  If you want to prevent pregnancy, talk to your health care provider about birth control (contraception). Osteoporosis and menopause  Osteoporosis is a disease in which the bones lose minerals and strength with aging. This can result in serious bone fractures. Your risk for osteoporosis can be identified using a bone density scan.  If you are 82 years of age or older, or if you are at risk for osteoporosis and fractures, ask your health care provider if you should be screened.  Ask your health care provider whether you should take a calcium or vitamin D supplement to lower your risk for osteoporosis.  Menopause may have certain physical symptoms and risks.  Hormone replacement therapy may reduce some of these symptoms and risks. Talk to your health care provider about whether hormone replacement therapy is right for you. Follow these instructions at home:  Schedule regular health, dental, and eye exams.  Stay current with your immunizations.  Do not use any tobacco products including cigarettes, chewing tobacco, or electronic cigarettes.  If you are pregnant, do not drink alcohol.  If you are breastfeeding, limit how much and how often you drink alcohol.  Limit alcohol intake to no more than 1 drink per day for nonpregnant women. One drink equals 12 ounces of beer, 5 ounces of  wine, or 1 ounces of hard liquor.  Do not use street drugs.  Do not share needles.  Ask your health care provider for help if you need support or information about quitting drugs.  Tell your health care provider if you often feel depressed.  Tell your health care provider if you have ever been abused or do not feel safe at home. This information is not intended to replace advice given to you by your health care provider. Make sure you discuss any questions you have with your health care provider. Document Released: 07/16/2010 Document Revised: 06/08/2015 Document Reviewed: 10/04/2014 Elsevier Interactive Patient Education  [employer].

## 2016-10-10 LAB — CYTOLOGY - PAP
Adequacy: ABSENT
Diagnosis: NEGATIVE
HPV: NOT DETECTED

## 2016-10-23 ENCOUNTER — Other Ambulatory Visit: Payer: Self-pay | Admitting: Internal Medicine

## 2016-11-15 ENCOUNTER — Ambulatory Visit (INDEPENDENT_AMBULATORY_CARE_PROVIDER_SITE_OTHER)
Admission: RE | Admit: 2016-11-15 | Discharge: 2016-11-15 | Disposition: A | Discharge status: Home / Self Care | Payer: BC Managed Care – PPO | Source: Ambulatory Visit | Attending: Internal Medicine | Admitting: Internal Medicine

## 2016-11-15 DIAGNOSIS — Z0000 Encounter for general adult medical examination without abnormal findings: Secondary | ICD-10-CM

## 2016-11-15 DIAGNOSIS — Z13820 Encounter for screening for osteoporosis: Secondary | ICD-10-CM

## 2016-11-17 DIAGNOSIS — Z13820 Encounter for screening for osteoporosis: Secondary | ICD-10-CM | POA: Diagnosis not present

## 2016-12-25 ENCOUNTER — Ambulatory Visit: Payer: BC Managed Care – PPO | Admitting: Family

## 2016-12-25 ENCOUNTER — Ambulatory Visit (INDEPENDENT_AMBULATORY_CARE_PROVIDER_SITE_OTHER)
Admission: RE | Admit: 2016-12-25 | Discharge: 2016-12-25 | Disposition: A | Discharge status: Home / Self Care | Payer: BC Managed Care – PPO | Source: Ambulatory Visit | Attending: Family | Admitting: Family

## 2016-12-25 ENCOUNTER — Other Ambulatory Visit (INDEPENDENT_AMBULATORY_CARE_PROVIDER_SITE_OTHER): Payer: BC Managed Care – PPO

## 2016-12-25 ENCOUNTER — Encounter: Payer: Self-pay | Admitting: Family

## 2016-12-25 VITALS — BP 126/78 | HR 83 | Temp 97.9°F | Ht 63.0 in | Wt 138.0 lb

## 2016-12-25 DIAGNOSIS — R0602 Shortness of breath: Secondary | ICD-10-CM

## 2016-12-25 DIAGNOSIS — R0989 Other specified symptoms and signs involving the circulatory and respiratory systems: Secondary | ICD-10-CM

## 2016-12-25 DIAGNOSIS — R002 Palpitations: Secondary | ICD-10-CM

## 2016-12-25 DIAGNOSIS — J209 Acute bronchitis, unspecified: Secondary | ICD-10-CM

## 2016-12-25 LAB — CBC
HCT: 40.5 % (ref 36.0–46.0)
Hemoglobin: 13.4 g/dL (ref 12.0–15.0)
MCHC: 33 g/dL (ref 30.0–36.0)
MCV: 84 fl (ref 78.0–100.0)
Platelets: 235 10*3/uL (ref 150.0–400.0)
RBC: 4.82 Mil/uL (ref 3.87–5.11)
RDW: 14.9 % (ref 11.5–15.5)
WBC: 9 10*3/uL (ref 4.0–10.5)

## 2016-12-25 LAB — COMPREHENSIVE METABOLIC PANEL
ALT: 14 U/L (ref 0–35)
AST: 18 U/L (ref 0–37)
Albumin: 4.3 g/dL (ref 3.5–5.2)
Alkaline Phosphatase: 64 U/L (ref 39–117)
BUN: 8 mg/dL (ref 6–23)
CO2: 31 mEq/L (ref 19–32)
Calcium: 8.9 mg/dL (ref 8.4–10.5)
Chloride: 102 mEq/L (ref 96–112)
Creatinine, Ser: 0.83 mg/dL (ref 0.40–1.20)
GFR: 74.4 mL/min (ref >60.00)
Glucose, Bld: 89 mg/dL (ref 70–99)
Potassium: 3.5 mEq/L (ref 3.5–5.1)
Sodium: 141 mEq/L (ref 135–145)
Total Bilirubin: 0.6 mg/dL (ref 0.2–1.2)
Total Protein: 7.1 g/dL (ref 6.0–8.3)

## 2016-12-25 LAB — TSH: TSH: 1.37 u[IU]/mL (ref 0.35–4.50)

## 2016-12-25 LAB — MAGNESIUM: Magnesium: 1.9 mg/dL (ref 1.5–2.5)

## 2016-12-25 MED ORDER — CEFDINIR 300 MG PO CAPS: 300.0000 mg | ORAL_CAPSULE | Freq: Two times a day (BID) | ORAL | 0 refills | Status: DC

## 2016-12-26 NOTE — Progress Notes (Signed)
Jordan Haynes is a 61 y.o. female with the following history as recorded in EpicCare:  Patient Active Problem List   Diagnosis Date Noted  . Routine general medical examination at a health care facility 06/20/2015  . Vaginal dryness, menopausal 01/13/2012  . Colon polyps 10/30/2011  . FH: colon cancer 10/30/2011  . History of vitamin D deficiency 10/30/2011  . Essential hypertension, benign 10/29/2011  . Hyperlipidemia 10/29/2011    Current Outpatient Medications  Medication Sig Dispense Refill  . atorvastatin (LIPITOR) 10 MG tablet TAKE 1 TABLET BY MOUTH DAILY AT 6 PM. 90 tablet 3  . diazepam (VALIUM) 5 MG tablet Insert vaginally 20-30 minutes prior to intercourse as needed 60 tablet 0  . eletriptan (RELPAX) 20 MG tablet Take 1 tablet (20 mg total) by mouth as needed for migraine or headache. May repeat in 2 hours if headache persists or recurs. 10 tablet 6  . estradiol (ESTRACE VAGINAL) 0.1 MG/GM vaginal cream Place 1 Applicatorful vaginally at bedtime. 42.5 g 12  . furosemide (LASIX) 20 MG tablet TAKE 1 TABLET EVERY DAY WITH POTASSIUM 90 tablet 3  . potassium chloride SA (KLOR-CON M20) 20 MEQ tablet Take 1 tablet (20 mEq total) by mouth daily. Keep 9/24 appointment for further refills 45 tablet 11  . Vaginal Moisturizer (LUVENA PREBIOTIC LUBRICANT) SOLN Use vaginally twice a week and with intercourse 39 mL 6  . cefdinir (OMNICEF) 300 MG capsule Take 1 capsule (300 mg total) by mouth 2 (two) times daily. 20 capsule 0   No current facility-administered medications for this visit.     Allergies: Patient has no known allergies.  Past Medical History:  Diagnosis Date  . Hyperlipidemia   . Hypertension   . Menopause   . Migraine   . Schamberg's disease 05/2013    Past Surgical History:  Procedure Laterality Date  . CHOLECYSTECTOMY    . FOOT SURGERY     bunion removal  . TONSILLECTOMY      Family History  Problem Relation Age of Onset  . Cancer Father   . Dementia Father    . Hypertension Mother   . Stroke Mother   . Hyperlipidemia Mother   . Dementia Mother   . Cancer Sister   . Cancer Paternal Grandfather     Social History   Tobacco Use  . Smoking status: Never Smoker  . Smokeless tobacco: Never Used  Substance Use Topics  . Alcohol use: No    Alcohol/week: 0.6 oz    Types: 1 Glasses of wine per week    Subjective:  Patient presents with 3 week history of cough/ congestion; "Just can't seem to shake it." Denies any fever but does admit to feeling some shortness of breath/ "lightheadedness"; + hoarseness; denies any sinus pain or pressure; cough is productive- "yellow green" not currently taking any OTC medications. Not prone to bronchitis or pneumonia;   Objective:  Vitals:   12/25/16 1320  BP: 126/78  Pulse: 83  Temp: 97.9 F (36.6 C)  TempSrc: Oral  SpO2: 99%  Weight: 138 lb (62.6 kg)  Height: 5\' 3"  (1.6 m)    General: Well developed, well nourished, in no acute distress  Skin : Warm and dry.  Head: Normocephalic and atraumatic  Eyes: Sclera and conjunctiva clear; pupils round and reactive to light; extraocular movements intact  Ears: External normal; canals clear; tympanic membranes normal  Oropharynx: Pink, supple. No suspicious lesions  Neck: Supple without thyromegaly, adenopathy  Lungs: Respirations unlabored; clear to auscultation  bilaterally without wheeze, rales, rhonchi  CVS exam: Irregular rhythm noted on exam- 2 normal beats followed by pause/ skipped beat;  Neurologic: Alert and oriented; speech intact; face symmetrical; moves all extremities well; CNII-XII intact without focal deficit  Assessment:  1. Pulse irregularity   2. Palpitations   3. Shortness of breath   4. Acute bronchitis, unspecified organism     Plan:  1., 2., & 3. During exam, patient's pulse was noted to be irregular; her pulse rate was not consistent- would alternate between 30-70; EKG did show possible trigeminy compared to EKG in 2015; she has no  prior history of cardiac disease; I did have Dr. Alain Marion exam the patient with me who agreed that cardiac evaluation was warranted but agreed that we did not have to send to ER; symptoms are still concerning for new onset A. Fib even though EKG does not show. ? Dehydration related to illness causing symptoms; will update CXR and labs today; she will be contacted about cardiology referral. Asked patient to avoid driving until after seeing cardiology. 4. Rx for Omnicef 300 mg 2 po qd x 10 days; increase fluids, rest;  No Follow-up on file.  Orders Placed This Encounter  Procedures  . DG Chest 2 View    Standing Status:   Future    Number of Occurrences:   1    Standing Expiration Date:   02/25/2018    Order Specific Question:   Reason for Exam (SYMPTOM  OR DIAGNOSIS REQUIRED)    Answer:   shortness of breath    Order Specific Question:   Is patient pregnant?    Answer:   No    Order Specific Question:   Preferred imaging location?    Answer:   [facility]    Order Specific Question:   Radiology Contrast Protocol - do NOT remove file path    Answer:   [email or web address]  . CBC    Standing Status:   Future    Number of Occurrences:   1    Standing Expiration Date:   12/25/2017  . Comprehensive metabolic panel    Standing Status:   Future    Number of Occurrences:   1    Standing Expiration Date:   12/25/2017  . Magnesium    Standing Status:   Future    Number of Occurrences:   1    Standing Expiration Date:   12/25/2017  . TSH    Standing Status:   Future    Number of Occurrences:   1    Standing Expiration Date:   12/25/2017  . Ambulatory referral to Cardiology    Referral Priority:   Emergency    Referral Type:   Consultation    Referral Reason:   Specialty Services Required    Requested Specialty:   Cardiology    Number of Visits Requested:   1  . EKG 12-Lead    Requested Prescriptions   Signed Prescriptions Disp Refills   . cefdinir (OMNICEF) 300 MG capsule 20 capsule 0    Sig: Take 1 capsule (300 mg total) by mouth 2 (two) times daily.

## 2016-12-26 NOTE — Progress Notes (Signed)
Reviewed with patient; labs and CXR are normal; she has appt with cardiology tomorrow at [facility] at 10:15 am; she expresses understanding.

## 2016-12-27 ENCOUNTER — Ambulatory Visit: Payer: BC Managed Care – PPO | Admitting: Cardiovascular Disease

## 2016-12-27 ENCOUNTER — Encounter: Payer: Self-pay | Admitting: Cardiovascular Disease

## 2016-12-27 VITALS — BP 138/70 | HR 73 | Ht 63.0 in | Wt 138.0 lb

## 2016-12-27 DIAGNOSIS — I10 Essential (primary) hypertension: Secondary | ICD-10-CM

## 2016-12-27 DIAGNOSIS — I493 Ventricular premature depolarization: Secondary | ICD-10-CM

## 2016-12-27 DIAGNOSIS — E785 Hyperlipidemia, unspecified: Secondary | ICD-10-CM

## 2016-12-27 NOTE — Assessment & Plan Note (Signed)
Jordan Haynes was referred to me by Jodi Mourning FNP for evaluation of asymptomatic PVCs demonstrated on the 12-lead cardiogram with the suspicion of A. fib but no documentation of this. She has a history of treated hypertension and hyperlipidemia. She's never had a stroke. She denies chest pain or shortness of breath. She saw her primary care physician for symptoms of bronchitis and apparently on exam she was noted to have "skipped beats" a 12-lead EKG demonstrated trigeminal PVCs. There was a question of PAF mentioned in the note but this was never demonstrated. I'm going to get a 2 week event monitor to further evaluate.

## 2016-12-27 NOTE — Addendum Note (Signed)
Addended by: Zebedee Iba on: 12/27/2016 04:24 PM   Modules accepted: Orders

## 2016-12-27 NOTE — Progress Notes (Signed)
12/27/2016 Jordan Haynes   [birth year]  [id number]  Primary Physician Hoyt Koch, MD Primary Cardiologist: Lorretta Harp MD Lupe Carney, [place]  HPI:  Jordan Haynes is a 61 y.o. fit-appearing separated Caucasian female mother of 2, grandmother and 4 grandchildren for her to me by Blondell Reveal FNP for evaluation of a symptomatic PVCs. She has a history of treated hypertension and hyperlipidemia. He has never had a heart attack or stroke. There is no family history. She does not smoke. 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 She went to see her PCP because of symptoms of bronchitis. Exam she was noted to have an irregular heartbeat and a twelve-lead EKG showed trigeminal PVCs. She is otherwise asymptomatic.    Current Meds  Medication Sig  . atorvastatin (LIPITOR) 10 MG tablet TAKE 1 TABLET BY MOUTH DAILY AT 6 PM.  . cefdinir (OMNICEF) 300 MG capsule Take 1 capsule (300 mg total) by mouth 2 (two) times daily.  . diazepam (VALIUM) 5 MG tablet Insert vaginally 20-30 minutes prior to intercourse as needed  . eletriptan (RELPAX) 20 MG tablet Take 1 tablet (20 mg total) by mouth as needed for migraine or headache. May repeat in 2 hours if headache persists or recurs.  Marland Kitchen estradiol (ESTRACE VAGINAL) 0.1 MG/GM vaginal cream Place 1 Applicatorful vaginally at bedtime.  . furosemide (LASIX) 20 MG tablet TAKE 1 TABLET EVERY DAY WITH POTASSIUM  . potassium chloride SA (KLOR-CON M20) 20 MEQ tablet Take 1 tablet (20 mEq total) by mouth daily. Keep 9/24 appointment for further refills  . Vaginal Moisturizer (LUVENA PREBIOTIC LUBRICANT) SOLN Use vaginally twice a week and with intercourse     No Known Allergies  Social History   Socioeconomic History  . Marital status: Married    Spouse name: Not on file  . Number of children: Not on file  . Years of education: Not on file  . Highest education level: Not on file  Social Needs  .  Financial resource strain: Not on file  . Food insecurity - worry: Not on file  . Food insecurity - inability: Not on file  . Transportation needs - medical: Not on file  . Transportation needs - non-medical: Not on file  Occupational History  . Not on file  Tobacco Use  . Smoking status: Never Smoker  . Smokeless tobacco: Never Used  Substance and Sexual Activity  . Alcohol use: No    Alcohol/week: 0.6 oz    Types: 1 Glasses of wine per week  . Drug use: No  . Sexual activity: Yes    Birth control/protection: Surgical  Other Topics Concern  . Not on file  Social History Narrative  . Not on file     Review of Systems: General: negative for chills, fever, night sweats or weight changes.  Cardiovascular: negative for chest pain, dyspnea on exertion, edema, orthopnea, palpitations, paroxysmal nocturnal dyspnea or shortness of breath Dermatological: negative for rash Respiratory: negative for cough or wheezing Urologic: negative for hematuria Abdominal: negative for nausea, vomiting, diarrhea, bright red blood per rectum, melena, or hematemesis Neurologic: negative for visual changes, syncope, or dizziness All other systems reviewed and are otherwise negative except as noted above.    Blood pressure 138/70, pulse 73, height 5\' 3"  (1.6 m), weight 138 lb (62.6 kg).  General appearance: alert and no distress Neck: no adenopathy, no carotid bruit, no JVD, supple, symmetrical, trachea midline and thyroid not enlarged,  symmetric, no tenderness/mass/nodules Lungs: clear to auscultation bilaterally Heart: regular rate and rhythm, S1, S2 normal, no murmur, click, rub or gallop Extremities: extremities normal, atraumatic, no cyanosis or edema Pulses: 2+ and symmetric Skin: Skin color, texture, turgor normal. No rashes or lesions Neurologic: Alert and oriented X 3, normal strength and tone. Normal symmetric reflexes. Normal coordination and gait  EKG sinus rhythm at 73 without ST or  T-wave changes. I Personally reviewed this EKG.  ASSESSMENT AND PLAN:   PVC's (premature ventricular contractions) Jordan Haynes was referred to me by Jodi Mourning FNP for evaluation of asymptomatic PVCs demonstrated on the 12-lead cardiogram with the suspicion of A. fib but no documentation of this. She has a history of treated hypertension and hyperlipidemia. She's never had a stroke. She denies chest pain or shortness of breath. She saw her primary care physician for symptoms of bronchitis and apparently on exam she was noted to have "skipped beats" a 12-lead EKG demonstrated trigeminal PVCs. There was a question of PAF mentioned in the note but this was never demonstrated. I'm going to get a 2 week event monitor to further evaluate.  Essential hypertension, benign History of essential hypertension blood pressure measured 138/70. She is on furosemide. Continue current medications  Hyperlipidemia History of hyperlipidemia on statin therapy with recent lipid profile performed 10/07/16 mm vessel and 74, LDL 93 and HDL of 60.      Lorretta Harp MD FACP,FACC,FAHA, [facility] 12/27/2016 10:56 AM

## 2016-12-27 NOTE — Assessment & Plan Note (Signed)
History of hyperlipidemia on statin therapy with recent lipid profile performed 10/07/16 mm vessel and 74, LDL 93 and HDL of 60.

## 2016-12-27 NOTE — Patient Instructions (Signed)
Medication Instructions: Your physician recommends that you continue on your current medications as directed. Please refer to the Current Medication list given to you today.  Testing/Procedures: Your physician has recommended that you wear a 14 day event monitor. Event monitors are medical devices that record the heart's electrical activity. Doctors most often [place] these monitors to diagnose arrhythmias. Arrhythmias are problems with the speed or rhythm of the heartbeat. The monitor is a small, portable device. You can wear one while you do your normal daily activities. This is usually used to diagnose what is causing palpitations/syncope (passing out).  Follow-Up: Your physician recommends that you schedule a follow-up appointment as needed with Dr. Gwenlyn Found.

## 2016-12-27 NOTE — Assessment & Plan Note (Signed)
History of essential hypertension blood pressure measured 138/70. She is on furosemide. Continue current medications

## 2016-12-30 ENCOUNTER — Ambulatory Visit (INDEPENDENT_AMBULATORY_CARE_PROVIDER_SITE_OTHER): Payer: BC Managed Care – PPO

## 2016-12-30 DIAGNOSIS — I493 Ventricular premature depolarization: Secondary | ICD-10-CM

## 2017-01-08 ENCOUNTER — Ambulatory Visit: Payer: Self-pay

## 2017-01-08 ENCOUNTER — Telehealth: Payer: Self-pay

## 2017-01-08 NOTE — Telephone Encounter (Signed)
   Reason for Disposition . Cough has been present for > 3 weeks  Answer Assessment - Initial Assessment Questions 1. ONSET: "When did the cough begin?"      12/25/16 2. SEVERITY: "How bad is the cough today?"      Severe 3. RESPIRATORY DISTRESS: "Describe your breathing."      Short of breath with coughing 4. FEVER: "Do you have a fever?" If so, ask: "What is your temperature, how was it measured, and when did it start?"     No 5. SPUTUM: "Describe the color of your sputum" (clear, white, yellow, green)     Yellow 6. HEMOPTYSIS: "Are you coughing up any blood?" If so ask: "How much?" (flecks, streaks, tablespoons, etc.)     No 7. CARDIAC HISTORY: "Do you have any history of heart disease?" (e.g., heart attack, congestive heart failure)      Wearing a Holter monitor   8. LUNG HISTORY: "Do you have any history of lung disease?"  (e.g., pulmonary embolus, asthma, emphysema)     No 9. PE RISK FACTORS: "Do you have a history of blood clots?" (or: recent major surgery, recent prolonged travel, bedridden )     No 10. OTHER SYMPTOMS: "Do you have any other symptoms?" (e.g., runny nose, wheezing, chest pain)       Some wheezing 11. PREGNANCY: "Is there any chance you are pregnant?" "When was your last menstrual period?"       No 12. TRAVEL: "Have you traveled out of the country in the last month?" (e.g., travel history, exposures)       No  Protocols used: [place]  Pt. Reports she finished antibiotic. States it helped "a little and I soon as I finished, my symptoms came back." Would something called in if possible. States she is at her daughter's house in [place] today.

## 2017-01-08 NOTE — Telephone Encounter (Signed)
Error

## 2017-01-08 NOTE — Telephone Encounter (Signed)
Sent Mychart message.

## 2017-01-08 NOTE — Telephone Encounter (Signed)
Cough can last 2-3 weeks after appropriate antibiotics. Is she taking zyrtec over the counter? This can help. Would not recommend another course of antibiotics. If SOB or problems breathing needs re-evaluation.

## 2017-01-17 ENCOUNTER — Encounter: Payer: Self-pay | Admitting: Family

## 2017-01-17 ENCOUNTER — Ambulatory Visit: Payer: BC Managed Care – PPO | Admitting: Family

## 2017-01-17 VITALS — BP 136/80 | HR 67 | Temp 98.1°F | Ht 63.0 in | Wt 140.0 lb

## 2017-01-17 DIAGNOSIS — J209 Acute bronchitis, unspecified: Secondary | ICD-10-CM

## 2017-01-17 DIAGNOSIS — J9801 Acute bronchospasm: Secondary | ICD-10-CM | POA: Diagnosis not present

## 2017-01-17 MED ORDER — DOXYCYCLINE HYCLATE 100 MG PO TABS: 100.0000 mg | ORAL_TABLET | Freq: Two times a day (BID) | ORAL | 0 refills | Status: DC

## 2017-01-17 MED ORDER — METHYLPREDNISOLONE 4 MG PO TBPK: ORAL_TABLET | ORAL | 0 refills | Status: DC

## 2017-01-17 NOTE — Progress Notes (Signed)
Jordan Haynes is a 62 y.o. female with the following history as recorded in EpicCare:  Patient Active Problem List   Diagnosis Date Noted  . PVC's (premature ventricular contractions) 12/27/2016  . Routine general medical examination at a health care facility 06/20/2015  . Vaginal dryness, menopausal 01/13/2012  . Colon polyps 10/30/2011  . FH: colon cancer 10/30/2011  . History of vitamin D deficiency 10/30/2011  . Essential hypertension, benign 10/29/2011  . Hyperlipidemia 10/29/2011    Current Outpatient Medications  Medication Sig Dispense Refill  . atorvastatin (LIPITOR) 10 MG tablet TAKE 1 TABLET BY MOUTH DAILY AT 6 PM. 90 tablet 3  . cefdinir (OMNICEF) 300 MG capsule Take 1 capsule (300 mg total) by mouth 2 (two) times daily. 20 capsule 0  . diazepam (VALIUM) 5 MG tablet Insert vaginally 20-30 minutes prior to intercourse as needed 60 tablet 0  . eletriptan (RELPAX) 20 MG tablet Take 1 tablet (20 mg total) by mouth as needed for migraine or headache. May repeat in 2 hours if headache persists or recurs. 10 tablet 6  . estradiol (ESTRACE VAGINAL) 0.1 MG/GM vaginal cream Place 1 Applicatorful vaginally at bedtime. 42.5 g 12  . furosemide (LASIX) 20 MG tablet TAKE 1 TABLET EVERY DAY WITH POTASSIUM 90 tablet 3  . potassium chloride SA (KLOR-CON M20) 20 MEQ tablet Take 1 tablet (20 mEq total) by mouth daily. Keep 9/24 appointment for further refills 45 tablet 11  . Vaginal Moisturizer (LUVENA PREBIOTIC LUBRICANT) SOLN Use vaginally twice a week and with intercourse 39 mL 6  . doxycycline (VIBRA-TABS) 100 MG tablet Take 1 tablet (100 mg total) by mouth 2 (two) times daily. 20 tablet 0  . methylPREDNISolone (MEDROL DOSEPAK) 4 MG TBPK tablet Take as directed 21 tablet 0   No current facility-administered medications for this visit.     Allergies: Patient has no known allergies.  Past Medical History:  Diagnosis Date  . Hyperlipidemia   . Hypertension   . Menopause   . Migraine    . Schamberg's disease 05/2013    Past Surgical History:  Procedure Laterality Date  . CHOLECYSTECTOMY    . FOOT SURGERY     bunion removal  . TONSILLECTOMY      Family History  Problem Relation Age of Onset  . Cancer Father   . Dementia Father   . Hypertension Mother   . Stroke Mother   . Hyperlipidemia Mother   . Dementia Mother   . Cancer Sister   . Cancer Paternal Grandfather     Social History   Tobacco Use  . Smoking status: Never Smoker  . Smokeless tobacco: Never Used  Substance Use Topics  . Alcohol use: No    Alcohol/week: 0.6 oz    Types: 1 Glasses of wine per week    Subjective:  Treated for sinus infection/ early bronchitis on 12/12 with Omnicef 300 mg bid x 10 days; finished the treatment and still had lingering cough; called in for advice on 12/26 and was told to try and treat symptoms for a few days; cough has continued and worsened; denies any chest pain or shortness of breath; "barking cough." Was diagnosed with sickness related asthma many years ago but does not need to keep inhaler; CXR done on 12/12 was normal;    Objective:  Vitals:   01/17/17 1312  BP: 136/80  Pulse: 67  Temp: 98.1 F (36.7 C)  TempSrc: Oral  SpO2: 97%  Weight: 140 lb (63.5 kg)  Height:  5\' 3"  (1.6 m)    General: Well developed, well nourished, in no acute distress  Skin : Warm and dry.  Head: Normocephalic and atraumatic  Eyes: Sclera and conjunctiva clear; pupils round and reactive to light; extraocular movements intact  Ears: External normal; canals clear; tympanic membranes normal  Oropharynx: Pink, supple. No suspicious lesions  Neck: Supple without thyromegaly, adenopathy  Lungs: Respirations unlabored; clear to auscultation bilaterally without wheeze, rales, rhonchi  CVS exam: normal rate and regular rhythm.  Abdomen: Soft; nontender; nondistended; normoactive bowel sounds; no masses or hepatosplenomegaly  Musculoskeletal: No deformities; no active joint  inflammation  Extremities: No edema, cyanosis, clubbing  Vessels: Symmetric bilaterally  Neurologic: Alert and oriented; speech intact; face symmetrical; moves all extremities well; CNII-XII intact without focal deficit   Assessment:  1. Acute bronchitis, unspecified organism   2. Acute bronchospasm     Plan:  Rx for Doxycycline 100 mg bid x 10 days and Medrol Dose pak; increase fluids, rest; am hesitant to use Levaquin due to patient's age and use of oral steroids at this time; follow-up worse, no better.   No Follow-up on file.  No orders of the defined types were placed in this encounter.   Requested Prescriptions   Signed Prescriptions Disp Refills  . methylPREDNISolone (MEDROL DOSEPAK) 4 MG TBPK tablet 21 tablet 0    Sig: Take as directed  . doxycycline (VIBRA-TABS) 100 MG tablet 20 tablet 0    Sig: Take 1 tablet (100 mg total) by mouth 2 (two) times daily.

## 2017-07-15 ENCOUNTER — Other Ambulatory Visit: Payer: Self-pay | Admitting: Internal Medicine

## 2017-07-15 DIAGNOSIS — Z1231 Encounter for screening mammogram for malignant neoplasm of breast: Secondary | ICD-10-CM

## 2017-08-11 ENCOUNTER — Ambulatory Visit
Admission: RE | Admit: 2017-08-11 | Discharge: 2017-08-11 | Disposition: A | Discharge status: Home / Self Care | Payer: BC Managed Care – PPO | Source: Ambulatory Visit | Attending: Internal Medicine | Admitting: Internal Medicine

## 2017-08-11 DIAGNOSIS — Z1231 Encounter for screening mammogram for malignant neoplasm of breast: Secondary | ICD-10-CM

## 2017-11-20 ENCOUNTER — Other Ambulatory Visit (INDEPENDENT_AMBULATORY_CARE_PROVIDER_SITE_OTHER): Payer: BC Managed Care – PPO

## 2017-11-20 ENCOUNTER — Encounter: Payer: Self-pay | Admitting: Internal Medicine

## 2017-11-20 ENCOUNTER — Ambulatory Visit (INDEPENDENT_AMBULATORY_CARE_PROVIDER_SITE_OTHER): Payer: BC Managed Care – PPO | Admitting: Internal Medicine

## 2017-11-20 VITALS — BP 130/80 | HR 43 | Temp 97.9°F | Ht 63.0 in | Wt 134.0 lb

## 2017-11-20 DIAGNOSIS — I10 Essential (primary) hypertension: Secondary | ICD-10-CM

## 2017-11-20 DIAGNOSIS — Z23 Encounter for immunization: Secondary | ICD-10-CM

## 2017-11-20 DIAGNOSIS — Z1159 Encounter for screening for other viral diseases: Secondary | ICD-10-CM

## 2017-11-20 DIAGNOSIS — Z0000 Encounter for general adult medical examination without abnormal findings: Secondary | ICD-10-CM

## 2017-11-20 DIAGNOSIS — E782 Mixed hyperlipidemia: Secondary | ICD-10-CM | POA: Diagnosis not present

## 2017-11-20 LAB — COMPREHENSIVE METABOLIC PANEL
ALT: 19 U/L (ref 0–35)
AST: 22 U/L (ref 0–37)
Albumin: 4.7 g/dL (ref 3.5–5.2)
Alkaline Phosphatase: 57 U/L (ref 39–117)
BUN: 12 mg/dL (ref 6–23)
CO2: 33 mEq/L — ABNORMAL HIGH (ref 19–32)
Calcium: 9.9 mg/dL (ref 8.4–10.5)
Chloride: 103 mEq/L (ref 96–112)
Creatinine, Ser: 0.71 mg/dL (ref 0.40–1.20)
GFR: 88.82 mL/min (ref >60.00)
Glucose, Bld: 95 mg/dL (ref 70–99)
Potassium: 3.9 mEq/L (ref 3.5–5.1)
Sodium: 141 mEq/L (ref 135–145)
Total Bilirubin: 0.8 mg/dL (ref 0.2–1.2)
Total Protein: 7.2 g/dL (ref 6.0–8.3)

## 2017-11-20 LAB — CBC
HCT: 42.9 % (ref 36.0–46.0)
Hemoglobin: 14.5 g/dL (ref 12.0–15.0)
MCHC: 33.8 g/dL (ref 30.0–36.0)
MCV: 87.4 fl (ref 78.0–100.0)
Platelets: 221 10*3/uL (ref 150.0–400.0)
RBC: 4.91 Mil/uL (ref 3.87–5.11)
RDW: 13.2 % (ref 11.5–15.5)
WBC: 7.6 10*3/uL (ref 4.0–10.5)

## 2017-11-20 LAB — LIPID PANEL
Cholesterol: 183 mg/dL (ref 0–200)
HDL: 69.7 mg/dL (ref >39.00)
LDL Cholesterol: 91 mg/dL (ref 0–99)
NonHDL: 113.43
Total CHOL/HDL Ratio: 3
Triglycerides: 111 mg/dL (ref 0.0–149.0)
VLDL: 22.2 mg/dL (ref 0.0–40.0)

## 2017-11-20 MED ORDER — FUROSEMIDE 20 MG PO TABS: ORAL_TABLET | ORAL | 3 refills | Status: DC

## 2017-11-20 MED ORDER — ELETRIPTAN HYDROBROMIDE 20 MG PO TABS: 20.0000 mg | ORAL_TABLET | ORAL | 6 refills | Status: DC | PRN

## 2017-11-20 MED ORDER — POTASSIUM CHLORIDE CRYS ER 20 MEQ PO TBCR: 20.0000 meq | EXTENDED_RELEASE_TABLET | Freq: Every day | ORAL | 11 refills | Status: DC

## 2017-11-20 MED ORDER — ATORVASTATIN CALCIUM 10 MG PO TABS: ORAL_TABLET | ORAL | 3 refills | Status: DC

## 2017-11-20 MED ORDER — ESTRADIOL 0.1 MG/GM VA CREA: 1.0000 | TOPICAL_CREAM | Freq: Every day | VAGINAL | 12 refills | Status: DC

## 2017-11-20 NOTE — Progress Notes (Signed)
   Subjective:    Patient ID: Jordan Haynes, female    DOB: [birth year], 62 y.o.   MRN: [id number]  HPI The patient is a 62 YO female coming in for physical.   PMH, [facility], social history reviewed and updated.   Review of Systems  Constitutional: Negative.   HENT: Negative.   Eyes: Negative.   Respiratory: Negative for cough, chest tightness and shortness of breath.   Cardiovascular: Negative for chest pain, palpitations and leg swelling.  Gastrointestinal: Negative for abdominal distention, abdominal pain, constipation, diarrhea, nausea and vomiting.  Musculoskeletal: Negative.   Skin: Negative.   Neurological: Negative.   Psychiatric/Behavioral: Negative.       Objective:   Physical Exam  Constitutional: She is oriented to person, place, and time. She appears well-developed and well-nourished.  HENT:  Head: Normocephalic and atraumatic.  Eyes: EOM are normal.  Neck: Normal range of motion.  Cardiovascular: Normal rate and regular rhythm.  Pulmonary/Chest: Effort normal and breath sounds normal. No respiratory distress. She has no wheezes. She has no rales.  Abdominal: Soft. Bowel sounds are normal. She exhibits no distension. There is no tenderness. There is no rebound.  Musculoskeletal: She exhibits no edema.  Neurological: She is alert and oriented to person, place, and time. Coordination normal.  Skin: Skin is warm and dry.  Psychiatric: She has a normal mood and affect.   Vitals:   11/20/17 1554  BP: 130/80  Pulse: (!) 43  Temp: 97.9 F (36.6 C)  TempSrc: Oral  SpO2: 97%  Weight: 134 lb (60.8 kg)  Height: 5\' 3"  (1.6 m)      Assessment & Plan:  Shingrix given at visit

## 2017-11-20 NOTE — Patient Instructions (Signed)

## 2017-11-21 ENCOUNTER — Encounter: Payer: Self-pay | Admitting: Internal Medicine

## 2017-11-21 LAB — HEPATITIS C ANTIBODY
Hepatitis C Ab: NONREACTIVE
SIGNAL TO CUT-OFF: 0.01 (ref <1.00)

## 2017-11-21 NOTE — Assessment & Plan Note (Signed)
Checking lipid panel and adjust lipitor if needed.  

## 2017-11-21 NOTE — Assessment & Plan Note (Signed)
BP at goal on lasix daily. Checking CMP and adjust as needed.

## 2017-11-21 NOTE — Assessment & Plan Note (Signed)
Flu shot up to date. Shingrix 1st given today. Tetanus up to date. Colonoscopy up to date. Mammogram up to date, pap smear up to date. Counseled about sun safety and mole surveillance. Counseled about the dangers of distracted driving. Given 10 year screening recommendations.

## 2018-01-20 ENCOUNTER — Ambulatory Visit (INDEPENDENT_AMBULATORY_CARE_PROVIDER_SITE_OTHER): Payer: BC Managed Care – PPO

## 2018-01-20 DIAGNOSIS — Z23 Encounter for immunization: Secondary | ICD-10-CM | POA: Diagnosis not present

## 2018-01-20 DIAGNOSIS — Z299 Encounter for prophylactic measures, unspecified: Secondary | ICD-10-CM

## 2018-03-03 IMAGING — DX DG CHEST 2V
2 series · 2 of 2 positions shown · non-contrast
Comparison: Two-view chest x-ray 02/14/2015

CLINICAL DATA: Cough and dyspnea for 2 weeks.

EXAM:
CHEST  2 VIEW

[chest pa]
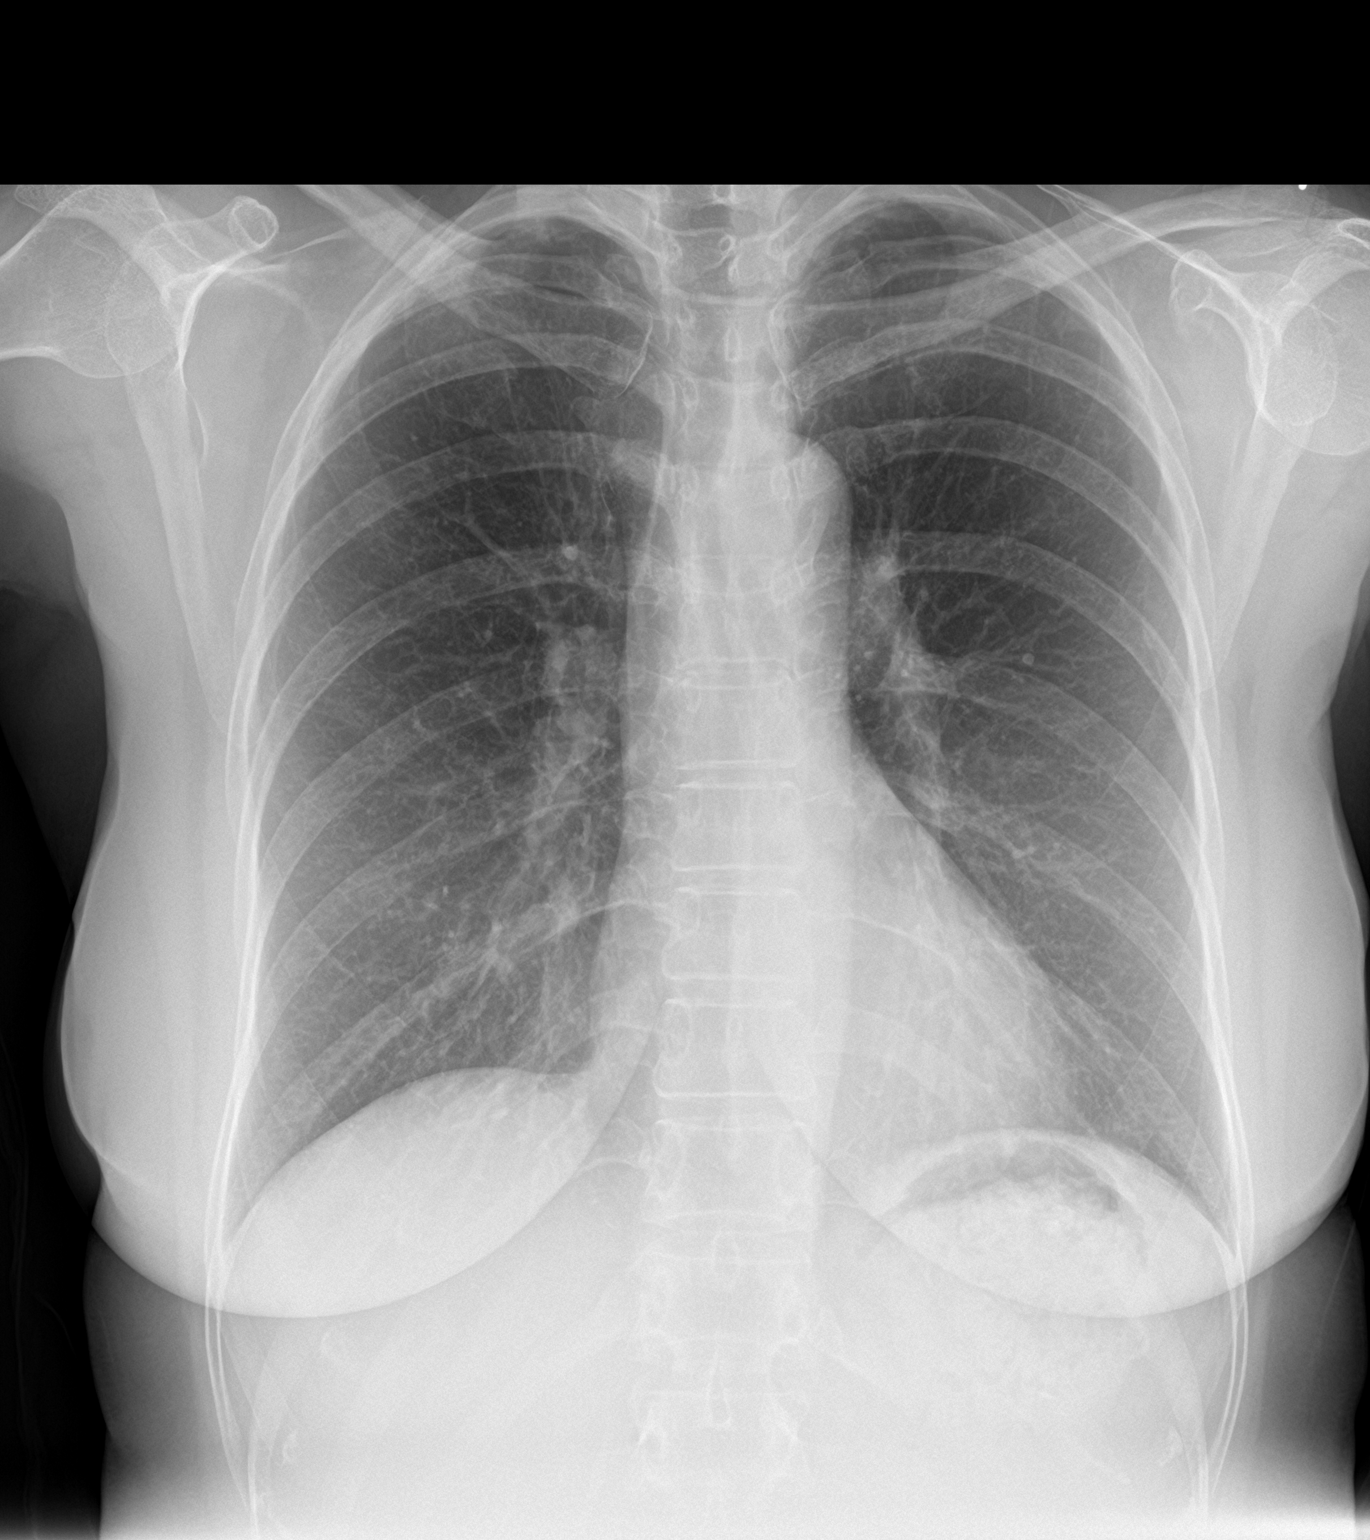

[chest lat]
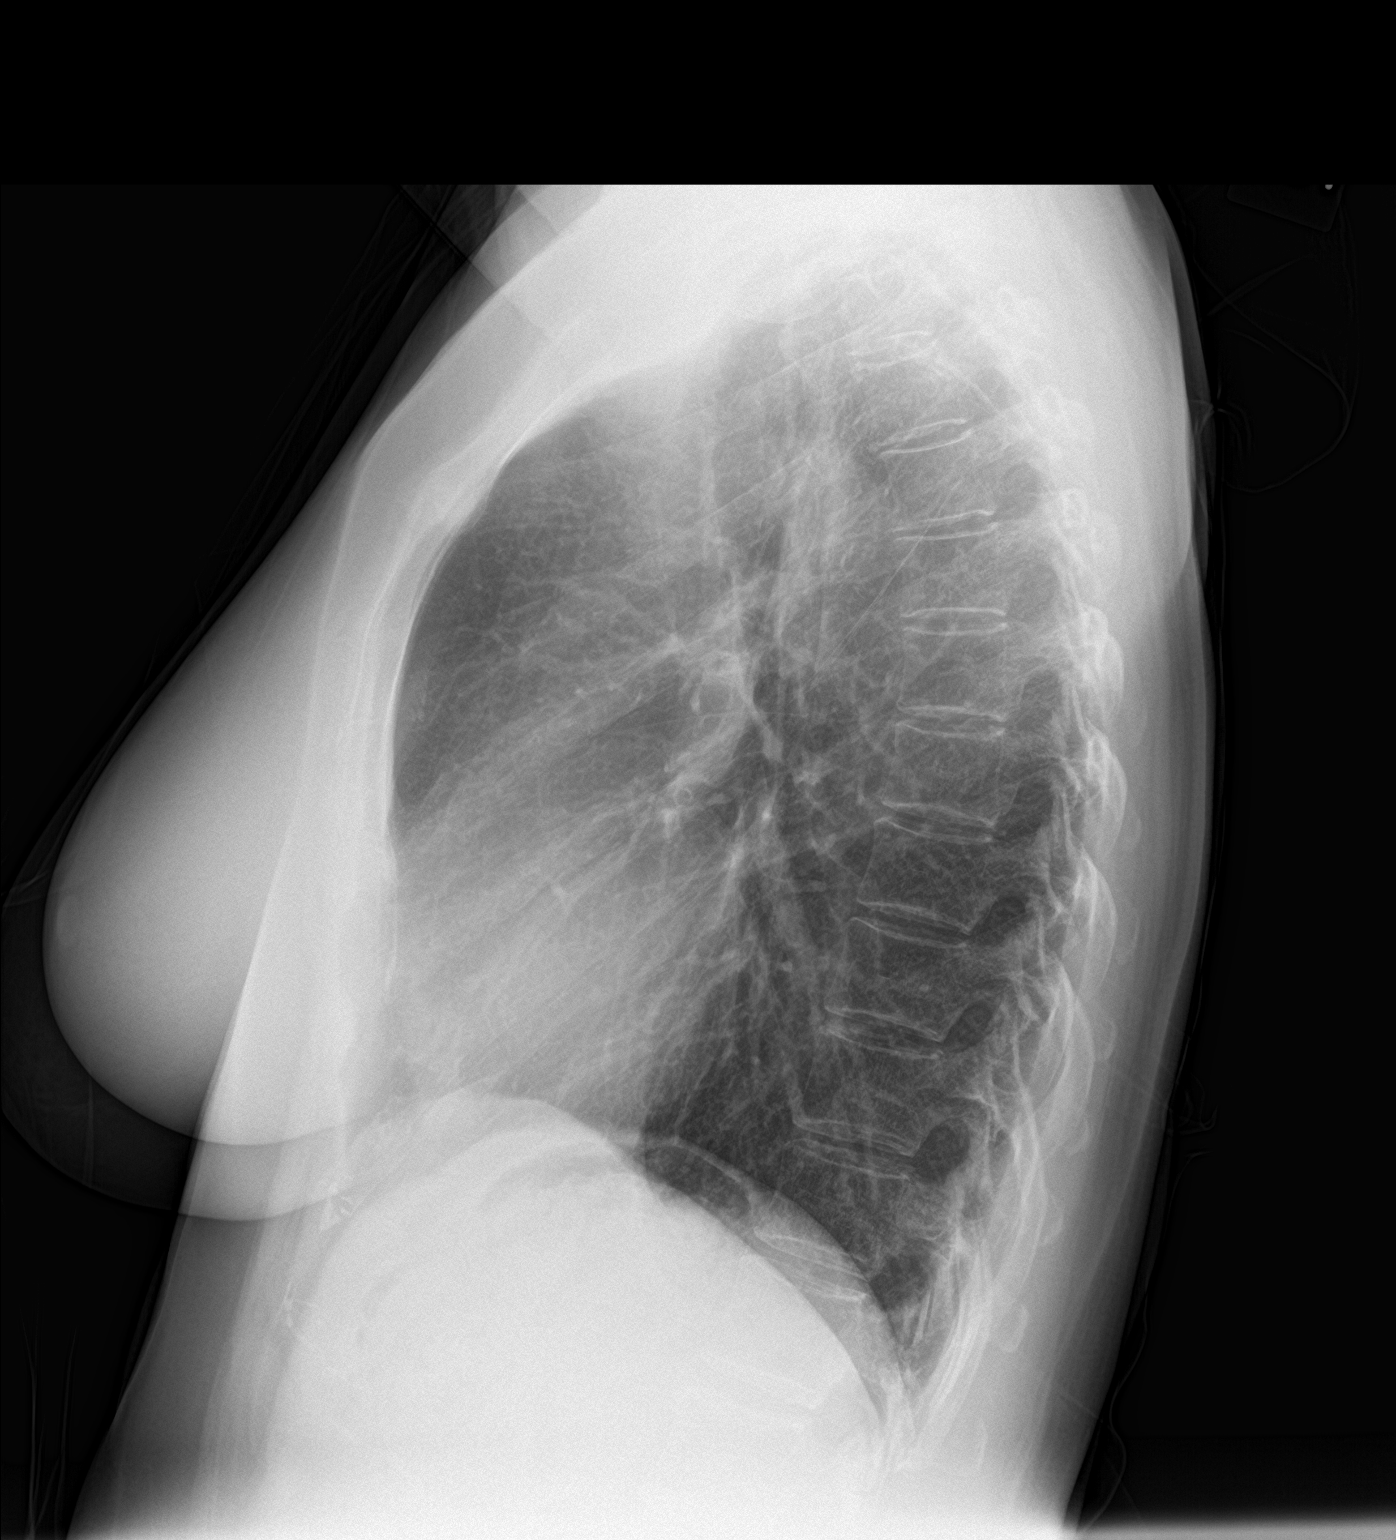

[2 of 2 positions shown; findings below may reference images not displayed]

FINDINGS: The heart size is normal. The lungs are clear. The visualized soft
tissues and bony thorax are unremarkable.
IMPRESSION: No active cardiopulmonary disease.

## 2018-07-24 ENCOUNTER — Ambulatory Visit
Admission: RE | Admit: 2018-07-24 | Discharge: 2018-07-24 | Disposition: A | Discharge status: Home / Self Care | Payer: BC Managed Care – PPO | Source: Ambulatory Visit

## 2018-07-24 ENCOUNTER — Other Ambulatory Visit: Payer: Self-pay

## 2018-07-24 ENCOUNTER — Other Ambulatory Visit: Payer: Self-pay | Admitting: Internal Medicine

## 2018-07-24 DIAGNOSIS — Z1231 Encounter for screening mammogram for malignant neoplasm of breast: Secondary | ICD-10-CM

## 2018-11-03 ENCOUNTER — Other Ambulatory Visit: Payer: Self-pay

## 2018-11-03 DIAGNOSIS — Z20822 Contact with and (suspected) exposure to covid-19: Secondary | ICD-10-CM

## 2018-11-04 LAB — NOVEL CORONAVIRUS, NAA: SARS-CoV-2, NAA: NOT DETECTED

## 2018-11-23 ENCOUNTER — Other Ambulatory Visit: Payer: Self-pay

## 2018-11-23 ENCOUNTER — Ambulatory Visit (INDEPENDENT_AMBULATORY_CARE_PROVIDER_SITE_OTHER): Payer: BC Managed Care – PPO | Admitting: Internal Medicine

## 2018-11-23 ENCOUNTER — Other Ambulatory Visit (INDEPENDENT_AMBULATORY_CARE_PROVIDER_SITE_OTHER): Payer: BC Managed Care – PPO

## 2018-11-23 ENCOUNTER — Encounter: Payer: Self-pay | Admitting: Internal Medicine

## 2018-11-23 VITALS — BP 142/84 | HR 41 | Temp 98.8°F | Ht 63.0 in | Wt 131.0 lb

## 2018-11-23 DIAGNOSIS — E782 Mixed hyperlipidemia: Secondary | ICD-10-CM

## 2018-11-23 DIAGNOSIS — I10 Essential (primary) hypertension: Secondary | ICD-10-CM | POA: Diagnosis not present

## 2018-11-23 DIAGNOSIS — Z0000 Encounter for general adult medical examination without abnormal findings: Secondary | ICD-10-CM

## 2018-11-23 DIAGNOSIS — Z8639 Personal history of other endocrine, nutritional and metabolic disease: Secondary | ICD-10-CM | POA: Diagnosis not present

## 2018-11-23 LAB — CBC
HCT: 40.2 % (ref 36.0–46.0)
Hemoglobin: 13.5 g/dL (ref 12.0–15.0)
MCHC: 33.7 g/dL (ref 30.0–36.0)
MCV: 87.1 fl (ref 78.0–100.0)
Platelets: 207 10*3/uL (ref 150.0–400.0)
RBC: 4.61 Mil/uL (ref 3.87–5.11)
RDW: 13.2 % (ref 11.5–15.5)
WBC: 6.8 10*3/uL (ref 4.0–10.5)

## 2018-11-23 LAB — COMPREHENSIVE METABOLIC PANEL
ALT: 13 U/L (ref 0–35)
AST: 18 U/L (ref 0–37)
Albumin: 4.5 g/dL (ref 3.5–5.2)
Alkaline Phosphatase: 59 U/L (ref 39–117)
BUN: 7 mg/dL (ref 6–23)
CO2: 29 mEq/L (ref 19–32)
Calcium: 9.2 mg/dL (ref 8.4–10.5)
Chloride: 102 mEq/L (ref 96–112)
Creatinine, Ser: 0.65 mg/dL (ref 0.40–1.20)
GFR: 92.22 mL/min (ref >60.00)
Glucose, Bld: 103 mg/dL — ABNORMAL HIGH (ref 70–99)
Potassium: 3.8 mEq/L (ref 3.5–5.1)
Sodium: 139 mEq/L (ref 135–145)
Total Bilirubin: 0.6 mg/dL (ref 0.2–1.2)
Total Protein: 6.9 g/dL (ref 6.0–8.3)

## 2018-11-23 LAB — LIPID PANEL
Cholesterol: 162 mg/dL (ref 0–200)
HDL: 63.8 mg/dL (ref >39.00)
LDL Cholesterol: 85 mg/dL (ref 0–99)
NonHDL: 98.56
Total CHOL/HDL Ratio: 3
Triglycerides: 68 mg/dL (ref 0.0–149.0)
VLDL: 13.6 mg/dL (ref 0.0–40.0)

## 2018-11-23 LAB — TSH: TSH: 2.73 u[IU]/mL (ref 0.35–4.50)

## 2018-11-23 LAB — VITAMIN D 25 HYDROXY (VIT D DEFICIENCY, FRACTURES): VITD: 36.77 ng/mL (ref 30.00–100.00)

## 2018-11-23 MED ORDER — ATORVASTATIN CALCIUM 10 MG PO TABS: ORAL_TABLET | ORAL | 3 refills | Status: DC

## 2018-11-23 MED ORDER — ELETRIPTAN HYDROBROMIDE 20 MG PO TABS: 20.0000 mg | ORAL_TABLET | ORAL | 6 refills | Status: DC | PRN

## 2018-11-23 MED ORDER — POTASSIUM CHLORIDE CRYS ER 20 MEQ PO TBCR: 20.0000 meq | EXTENDED_RELEASE_TABLET | Freq: Every day | ORAL | 11 refills | Status: DC

## 2018-11-23 MED ORDER — FUROSEMIDE 20 MG PO TABS: ORAL_TABLET | ORAL | 3 refills | Status: DC

## 2018-11-23 MED ORDER — ESTRADIOL 0.1 MG/GM VA CREA: 1.0000 | TOPICAL_CREAM | Freq: Every day | VAGINAL | 12 refills | Status: DC

## 2018-11-23 NOTE — Progress Notes (Signed)
   Subjective:   Patient ID: Jordan Haynes, female    DOB: Jan 11, 1956, 63 y.o.   MRN: [id number]  HPI The patient is a 63 YO female coming in for physical.   PMH, [facility], social history reviewed and updated.   Review of Systems  Constitutional: Negative.   HENT: Negative.   Eyes: Negative.   Respiratory: Negative for cough, chest tightness and shortness of breath.   Cardiovascular: Negative for chest pain, palpitations and leg swelling.  Gastrointestinal: Negative for abdominal distention, abdominal pain, constipation, diarrhea, nausea and vomiting.  Musculoskeletal: Negative.   Skin: Negative.   Neurological: Negative.   Psychiatric/Behavioral: Negative.     Objective:  Physical Exam Constitutional:      Appearance: She is well-developed.  HENT:     Head: Normocephalic and atraumatic.  Neck:     Musculoskeletal: Normal range of motion.  Cardiovascular:     Rate and Rhythm: Normal rate and regular rhythm.  Pulmonary:     Effort: Pulmonary effort is normal. No respiratory distress.     Breath sounds: Normal breath sounds. No wheezing or rales.  Abdominal:     General: Bowel sounds are normal. There is no distension.     Palpations: Abdomen is soft.     Tenderness: There is no abdominal tenderness. There is no rebound.  Skin:    General: Skin is warm and dry.  Neurological:     Mental Status: She is alert and oriented to person, place, and time.     Coordination: Coordination normal.     Vitals:   11/23/18 1422  BP: (!) 142/84  Pulse: (!) 41  Temp: 98.8 F (37.1 C)  TempSrc: Oral  SpO2: 98%  Weight: 131 lb (59.4 kg)  Height: 5\' 3"  (1.6 m)    Assessment & Plan:

## 2018-11-23 NOTE — Patient Instructions (Signed)
Health Maintenance, Female Adopting a healthy lifestyle and getting preventive care are important in promoting health and wellness. Ask your health care provider about:  The right schedule for you to have regular tests and exams.  Things you can do on your own to prevent diseases and keep yourself healthy. What should I know about diet, weight, and exercise? Eat a healthy diet   Eat a diet that includes plenty of vegetables, fruits, low-fat dairy products, and lean protein.  Do not eat a lot of foods that are high in solid fats, added sugars, or sodium. Maintain a healthy weight Body mass index (BMI) is used to identify weight problems. It estimates body fat based on height and weight. Your health care provider can help determine your BMI and help you achieve or maintain a healthy weight. Get regular exercise Get regular exercise. This is one of the most important things you can do for your health. Most adults should:  Exercise for at least 150 minutes each week. The exercise should increase your heart rate and make you sweat (moderate-intensity exercise).  Do strengthening exercises at least twice a week. This is in addition to the moderate-intensity exercise.  Spend less time sitting. Even light physical activity can be beneficial. Watch cholesterol and blood lipids Have your blood tested for lipids and cholesterol at 62 years of age, then have this test every 5 years. Have your cholesterol levels checked more often if:  Your lipid or cholesterol levels are high.  You are older than 62 years of age.  You are at high risk for heart disease. What should I know about cancer screening? Depending on your health history and family history, you may need to have cancer screening at various ages. This may include screening for:  Breast cancer.  Cervical cancer.  Colorectal cancer.  Skin cancer.  Lung cancer. What should I know about heart disease, diabetes, and high blood  pressure? Blood pressure and heart disease  High blood pressure causes heart disease and increases the risk of stroke. This is more likely to develop in people who have high blood pressure readings, are of African descent, or are overweight.  Have your blood pressure checked: ? Every 3-5 years if you are 18-39 years of age. ? Every year if you are 40 years old or older. Diabetes Have regular diabetes screenings. This checks your fasting blood sugar level. Have the screening done:  Once every three years after age 40 if you are at a normal weight and have a low risk for diabetes.  More often and at a younger age if you are overweight or have a high risk for diabetes. What should I know about preventing infection? Hepatitis B If you have a higher risk for hepatitis B, you should be screened for this virus. Talk with your health care provider to find out if you are at risk for hepatitis B infection. Hepatitis C Testing is recommended for:  Everyone born from 1945 through 1965.  Anyone with known risk factors for hepatitis C. Sexually transmitted infections (STIs)  Get screened for STIs, including gonorrhea and chlamydia, if: ? You are sexually active and are younger than 62 years of age. ? You are older than 62 years of age and your health care provider tells you that you are at risk for this type of infection. ? Your sexual activity has changed since you were last screened, and you are at increased risk for chlamydia or gonorrhea. Ask your health care provider if   you are at risk.  Ask your health care provider about whether you are at high risk for HIV. Your health care provider may recommend a prescription medicine to help prevent HIV infection. If you choose to take medicine to prevent HIV, you should first get tested for HIV. You should then be tested every 3 months for as long as you are taking the medicine. Pregnancy  If you are about to stop having your period (premenopausal) and  you may become pregnant, seek counseling before you get pregnant.  Take 400 to 800 micrograms (mcg) of folic acid every day if you become pregnant.  Ask for birth control (contraception) if you want to prevent pregnancy. Osteoporosis and menopause Osteoporosis is a disease in which the bones lose minerals and strength with aging. This can result in bone fractures. If you are 65 years old or older, or if you are at risk for osteoporosis and fractures, ask your health care provider if you should:  Be screened for bone loss.  Take a calcium or vitamin D supplement to lower your risk of fractures.  Be given hormone replacement therapy (HRT) to treat symptoms of menopause. Follow these instructions at home: Lifestyle  Do not use any products that contain nicotine or tobacco, such as cigarettes, e-cigarettes, and chewing tobacco. If you need help quitting, ask your health care provider.  Do not use street drugs.  Do not share needles.  Ask your health care provider for help if you need support or information about quitting drugs. Alcohol use  Do not drink alcohol if: ? Your health care provider tells you not to drink. ? You are pregnant, may be pregnant, or are planning to become pregnant.  If you drink alcohol: ? Limit how much you use to 0-1 drink a day. ? Limit intake if you are breastfeeding.  Be aware of how much alcohol is in your drink. In the [place] one drink equals one 12 oz bottle of beer (355 mL), one 5 oz glass of wine (148 mL), or one 1 oz glass of hard liquor (44 mL). General instructions  Schedule regular health, dental, and eye exams.  Stay current with your vaccines.  Tell your health care provider if: ? You often feel depressed. ? You have ever been abused or do not feel safe at home. Summary  Adopting a healthy lifestyle and getting preventive care are important in promoting health and wellness.  Follow your health care provider's instructions about healthy  diet, exercising, and getting tested or screened for diseases.  Follow your health care provider's instructions on monitoring your cholesterol and blood pressure. This information is not intended to replace advice given to you by your health care provider. Make sure you discuss any questions you have with your health care provider. Document Released: 07/16/2010 Document Revised: 12/24/2017 Document Reviewed: 12/24/2017 Elsevier Patient Education  2020 [employer]vier Inc.  

## 2018-11-24 NOTE — Assessment & Plan Note (Signed)
Checking vitamin D level and adjust as needed.  

## 2018-11-24 NOTE — Assessment & Plan Note (Signed)
Checking lipid panel and adjust lipitor 10 mg daily as needed.  

## 2018-11-24 NOTE — Assessment & Plan Note (Signed)
BP mildly high today, normal at home. Taking lasix 20 mg daily. Checking CMP and adjust as needed.

## 2018-11-24 NOTE — Assessment & Plan Note (Signed)
Flu shot complete for season. Shingrix complete. Tetanus up to date. Colonoscopy up to date. Mammogram up to date, pap smear up to date. Counseled about sun safety and mole surveillance. Counseled about the dangers of distracted driving. Given 10 year screening recommendations.

## 2018-12-07 ENCOUNTER — Telehealth: Payer: Self-pay

## 2018-12-07 NOTE — Telephone Encounter (Signed)
Copied from [place] [phone]. Topic: General - Inquiry >> Dec 07, 2018  3:39 PM Alease Frame wrote: Reason for CRM: Patient needs a call back

## 2018-12-07 NOTE — Telephone Encounter (Signed)
Patient had labs denied that three labs were denied  Vit D needs to be diagnostic   States there were two other that were charged as well, I explained that the other ones shouldn't need to be changed as those are usually covered by insurance for annual visit. Gave patient the billing numbers to call to see what exactly is going

## 2019-07-01 ENCOUNTER — Other Ambulatory Visit: Payer: Self-pay | Admitting: Internal Medicine

## 2019-07-01 DIAGNOSIS — Z1231 Encounter for screening mammogram for malignant neoplasm of breast: Secondary | ICD-10-CM

## 2019-07-26 ENCOUNTER — Ambulatory Visit
Admission: RE | Admit: 2019-07-26 | Discharge: 2019-07-26 | Disposition: A | Discharge status: Home / Self Care | Payer: BC Managed Care – PPO | Source: Ambulatory Visit | Attending: Internal Medicine | Admitting: Internal Medicine

## 2019-07-26 ENCOUNTER — Other Ambulatory Visit: Payer: Self-pay

## 2019-07-26 DIAGNOSIS — Z1231 Encounter for screening mammogram for malignant neoplasm of breast: Secondary | ICD-10-CM

## 2019-09-22 ENCOUNTER — Other Ambulatory Visit: Payer: Self-pay

## 2019-09-22 ENCOUNTER — Ambulatory Visit: Payer: BC Managed Care – PPO | Admitting: Family

## 2019-09-22 ENCOUNTER — Ambulatory Visit (INDEPENDENT_AMBULATORY_CARE_PROVIDER_SITE_OTHER): Payer: BC Managed Care – PPO

## 2019-09-22 VITALS — BP 140/70 | HR 41 | Temp 98.5°F | Ht 63.0 in | Wt 124.0 lb

## 2019-09-22 DIAGNOSIS — M79641 Pain in right hand: Secondary | ICD-10-CM

## 2019-09-22 DIAGNOSIS — M791 Myalgia, unspecified site: Secondary | ICD-10-CM | POA: Diagnosis not present

## 2019-09-22 DIAGNOSIS — Z23 Encounter for immunization: Secondary | ICD-10-CM

## 2019-09-22 MED ORDER — MELOXICAM 15 MG PO TABS: 15.0000 mg | ORAL_TABLET | Freq: Every day | ORAL | 0 refills | Status: DC

## 2019-09-22 NOTE — Progress Notes (Signed)
Jordan Haynes is a 64 y.o. female with the following history as recorded in [facility]:  Patient Active Problem List   Diagnosis Date Noted  . PVC's (premature ventricular contractions) 12/27/2016  . Routine general medical examination at a health care facility 06/20/2015  . Vaginal dryness, menopausal 01/13/2012  . Colon polyps 10/30/2011  . History of vitamin D deficiency 10/30/2011  . Essential hypertension, benign 10/29/2011  . Hyperlipidemia 10/29/2011    Current Outpatient Medications  Medication Sig Dispense Refill  . atorvastatin (LIPITOR) 10 MG tablet TAKE 1 TABLET BY MOUTH DAILY AT 6 PM. 90 tablet 3  . diazepam (VALIUM) 5 MG tablet Insert vaginally 20-30 minutes prior to intercourse as needed 60 tablet 0  . eletriptan (RELPAX) 20 MG tablet Take 1 tablet (20 mg total) by mouth as needed for migraine or headache. May repeat in 2 hours if headache persists or recurs. 10 tablet 6  . estradiol (ESTRACE VAGINAL) 0.1 MG/GM vaginal cream Place 1 Applicatorful vaginally at bedtime. 42.5 g 12  . furosemide (LASIX) 20 MG tablet TAKE 1 TABLET EVERY DAY WITH POTASSIUM 90 tablet 3  . potassium chloride SA (KLOR-CON M20) 20 MEQ tablet Take 1 tablet (20 mEq total) by mouth daily. Keep 9/24 appointment for further refills 45 tablet 11  . Vaginal Moisturizer (LUVENA PREBIOTIC LUBRICANT) SOLN Use vaginally twice a week and with intercourse 39 mL 6  . meloxicam (MOBIC) 15 MG tablet Take 1 tablet (15 mg total) by mouth daily. 30 tablet 0   No current facility-administered medications for this visit.    Allergies: Patient has no known allergies.  Past Medical History:  Diagnosis Date  . Hyperlipidemia   . Hypertension   . Menopause   . Migraine   . Schamberg's disease 05/2013    Past Surgical History:  Procedure Laterality Date  . CHOLECYSTECTOMY    . FOOT SURGERY     bunion removal  . TONSILLECTOMY      Family History  Problem Relation Age of Onset  . Cancer Father   .  Dementia Father   . Hypertension Mother   . Stroke Mother   . Hyperlipidemia Mother   . Dementia Mother   . Cancer Sister   . Cancer Paternal Grandfather     Social History   Tobacco Use  . Smoking status: Never Smoker  . Smokeless tobacco: Never Used  Substance Use Topics  . Alcohol use: No    Alcohol/week: 1.0 standard drink    Types: 1 Glasses of wine per week    Subjective:  Right hand pain x "months"- specifically localized in 3rd finger/ does seem to be worsening recently; no known injury or trauma;  Sister had RA has a young child; mother had some type of arthritis- ? OA;  Would like to get flu shot today;    Objective:  Vitals:   09/22/19 0935  BP: 140/70  Pulse: (!) 41  Temp: 98.5 F (36.9 C)  TempSrc: Oral  SpO2: 99%  Weight: 124 lb (56.2 kg)  Height: 5\' 3"  (1.6 m)    General: Well developed, well nourished, in no acute distress  Head: Normocephalic and atraumatic  Lungs: Respirations unlabored;  Musculoskeletal: No deformities- swelling noted over 3rd finger right hand; no active joint inflammation  Extremities: No edema, cyanosis, clubbing  Vessels: Symmetric bilaterally  Neurologic: Alert and oriented; speech intact; face symmetrical; moves all extremities well; CNII-XII intact without focal deficit   Assessment:  1. Right hand pain   2.  Myalgia   3. Flu vaccine need     Plan:  Concern for RA vs OA; will update labs today and X-ray of right hand; trial of Mobic 15 mg daily prn; follow-up to be determined- may need to see rheumatology;  Flu shot updated;   This visit occurred during the SARS-CoV-2 public health emergency.  Safety protocols were in place, including screening questions prior to the visit, additional usage of staff PPE, and extensive cleaning of exam room while observing appropriate contact time as indicated for disinfecting solutions.     No follow-ups on file.  Orders Placed This Encounter  Procedures  . DG Hand Complete Right     Standing Status:   Future    Number of Occurrences:   1    Standing Expiration Date:   09/21/2020    Order Specific Question:   Reason for Exam (SYMPTOM  OR DIAGNOSIS REQUIRED)    Answer:   right hand pain    Order Specific Question:   Preferred imaging location?    Answer:   [facility]    Order Specific Question:   Radiology Contrast Protocol - do NOT remove file path    Answer:   [email or web address]  . Flu Vaccine QUAD 36+ mos IM  . Antinuclear Antib (ANA)    Standing Status:   Future    Number of Occurrences:   1    Standing Expiration Date:   09/21/2020  . Sedimentation rate    Standing Status:   Future    Number of Occurrences:   1    Standing Expiration Date:   09/21/2020  . Rheumatoid Factor    Standing Status:   Future    Number of Occurrences:   1    Standing Expiration Date:   09/21/2020    Requested Prescriptions   Signed Prescriptions Disp Refills  . meloxicam (MOBIC) 15 MG tablet 30 tablet 0    Sig: Take 1 tablet (15 mg total) by mouth daily.

## 2019-09-24 LAB — ANTI-NUCLEAR AB-TITER (ANA TITER): ANA Titer 1: 1:40 {titer} — ABNORMAL HIGH

## 2019-09-24 LAB — RHEUMATOID FACTOR: Rheumatoid fact SerPl-aCnc: <14 IU/mL (ref <14)

## 2019-09-24 LAB — SEDIMENTATION RATE: Sed Rate: 2 mm/h (ref 0–30)

## 2019-09-24 LAB — ANA: Anti Nuclear Antibody (ANA): POSITIVE — AB

## 2019-09-28 ENCOUNTER — Encounter: Payer: Self-pay | Admitting: Family

## 2019-09-29 ENCOUNTER — Other Ambulatory Visit: Payer: Self-pay | Admitting: Family

## 2019-09-29 DIAGNOSIS — R768 Other specified abnormal immunological findings in serum: Secondary | ICD-10-CM

## 2019-11-05 ENCOUNTER — Other Ambulatory Visit: Payer: Self-pay | Admitting: Family

## 2019-11-05 MED ORDER — MELOXICAM 15 MG PO TABS: 15.0000 mg | ORAL_TABLET | Freq: Every day | ORAL | 0 refills | Status: AC

## 2019-11-25 ENCOUNTER — Ambulatory Visit (INDEPENDENT_AMBULATORY_CARE_PROVIDER_SITE_OTHER): Payer: BC Managed Care – PPO | Admitting: Internal Medicine

## 2019-11-25 ENCOUNTER — Encounter: Payer: Self-pay | Admitting: Internal Medicine

## 2019-11-25 ENCOUNTER — Other Ambulatory Visit: Payer: Self-pay

## 2019-11-25 VITALS — BP 132/66 | HR 43 | Temp 97.9°F | Ht 63.0 in | Wt 125.0 lb

## 2019-11-25 DIAGNOSIS — Z0000 Encounter for general adult medical examination without abnormal findings: Secondary | ICD-10-CM | POA: Diagnosis not present

## 2019-11-25 DIAGNOSIS — I10 Essential (primary) hypertension: Secondary | ICD-10-CM

## 2019-11-25 DIAGNOSIS — E782 Mixed hyperlipidemia: Secondary | ICD-10-CM

## 2019-11-25 LAB — COMPREHENSIVE METABOLIC PANEL
ALT: 17 U/L (ref 0–35)
AST: 19 U/L (ref 0–37)
Albumin: 4.2 g/dL (ref 3.5–5.2)
Alkaline Phosphatase: 41 U/L (ref 39–117)
BUN: 11 mg/dL (ref 6–23)
CO2: 34 mEq/L — ABNORMAL HIGH (ref 19–32)
Calcium: 9 mg/dL (ref 8.4–10.5)
Chloride: 105 mEq/L (ref 96–112)
Creatinine, Ser: 0.82 mg/dL (ref 0.40–1.20)
GFR: 76.11 mL/min (ref >60.00)
Glucose, Bld: 77 mg/dL (ref 70–99)
Potassium: 4 mEq/L (ref 3.5–5.1)
Sodium: 142 mEq/L (ref 135–145)
Total Bilirubin: 0.7 mg/dL (ref 0.2–1.2)
Total Protein: 6.3 g/dL (ref 6.0–8.3)

## 2019-11-25 LAB — LIPID PANEL
Cholesterol: 147 mg/dL (ref 0–200)
HDL: 65.3 mg/dL (ref >39.00)
LDL Cholesterol: 69 mg/dL (ref 0–99)
NonHDL: 81.5
Total CHOL/HDL Ratio: 2
Triglycerides: 63 mg/dL (ref 0.0–149.0)
VLDL: 12.6 mg/dL (ref 0.0–40.0)

## 2019-11-25 LAB — CBC
HCT: 40.5 % (ref 36.0–46.0)
Hemoglobin: 13.6 g/dL (ref 12.0–15.0)
MCHC: 33.4 g/dL (ref 30.0–36.0)
MCV: 87.3 fl (ref 78.0–100.0)
Platelets: 179 10*3/uL (ref 150.0–400.0)
RBC: 4.64 Mil/uL (ref 3.87–5.11)
RDW: 13.5 % (ref 11.5–15.5)
WBC: 6.2 10*3/uL (ref 4.0–10.5)

## 2019-11-25 NOTE — Progress Notes (Signed)
   Subjective:   Patient ID: Jordan Haynes, female    DOB: 03-28-1955, 64 y.o.   MRN: [id number]  HPI The patient is a 64 YO female coming in for physical.   PMH, [place], social history reviewed and updated  Review of Systems  Constitutional: Negative.   HENT: Negative.   Eyes: Negative.   Respiratory: Negative for cough, chest tightness and shortness of breath.   Cardiovascular: Negative for chest pain, palpitations and leg swelling.  Gastrointestinal: Negative for abdominal distention, abdominal pain, constipation, diarrhea, nausea and vomiting.  Musculoskeletal: Negative.   Skin: Negative.   Neurological: Negative.   Psychiatric/Behavioral: Negative.     Objective:  Physical Exam Constitutional:      Appearance: She is well-developed.  HENT:     Head: Normocephalic and atraumatic.  Cardiovascular:     Rate and Rhythm: Normal rate and regular rhythm.  Pulmonary:     Effort: Pulmonary effort is normal. No respiratory distress.     Breath sounds: Normal breath sounds. No wheezing or rales.  Abdominal:     General: Bowel sounds are normal. There is no distension.     Palpations: Abdomen is soft.     Tenderness: There is no abdominal tenderness. There is no rebound.  Musculoskeletal:     Cervical back: Normal range of motion.  Skin:    General: Skin is warm and dry.  Neurological:     Mental Status: She is alert and oriented to person, place, and time.     Coordination: Coordination normal.     Vitals:   11/25/19 1458  BP: 132/66  Pulse: (!) 43  Temp: 97.9 F (36.6 C)  TempSrc: Oral  SpO2: 99%  Weight: 125 lb (56.7 kg)  Height: 5\' 3"  (1.6 m)    This visit occurred during the SARS-CoV-2 public health emergency.  Safety protocols were in place, including screening questions prior to the visit, additional usage of staff PPE, and extensive cleaning of exam room while observing appropriate contact time as indicated for disinfecting solutions.   Assessment  & Plan:

## 2019-11-25 NOTE — Patient Instructions (Signed)
Health Maintenance, Female Adopting a healthy lifestyle and getting preventive care are important in promoting health and wellness. Ask your health care provider about:  The right schedule for you to have regular tests and exams.  Things you can do on your own to prevent diseases and keep yourself healthy. What should I know about diet, weight, and exercise? Eat a healthy diet   Eat a diet that includes plenty of vegetables, fruits, low-fat dairy products, and lean protein.  Do not eat a lot of foods that are high in solid fats, added sugars, or sodium. Maintain a healthy weight Body mass index (BMI) is used to identify weight problems. It estimates body fat based on height and weight. Your health care provider can help determine your BMI and help you achieve or maintain a healthy weight. Get regular exercise Get regular exercise. This is one of the most important things you can do for your health. Most adults should:  Exercise for at least 150 minutes each week. The exercise should increase your heart rate and make you sweat (moderate-intensity exercise).  Do strengthening exercises at least twice a week. This is in addition to the moderate-intensity exercise.  Spend less time sitting. Even light physical activity can be beneficial. Watch cholesterol and blood lipids Have your blood tested for lipids and cholesterol at 63 years of age, then have this test every 5 years. Have your cholesterol levels checked more often if:  Your lipid or cholesterol levels are high.  You are older than 63 years of age.  You are at high risk for heart disease. What should I know about cancer screening? Depending on your health history and family history, you may need to have cancer screening at various ages. This may include screening for:  Breast cancer.  Cervical cancer.  Colorectal cancer.  Skin cancer.  Lung cancer. What should I know about heart disease, diabetes, and high blood  pressure? Blood pressure and heart disease  High blood pressure causes heart disease and increases the risk of stroke. This is more likely to develop in people who have high blood pressure readings, are of African descent, or are overweight.  Have your blood pressure checked: ? Every 3-5 years if you are 18-39 years of age. ? Every year if you are 40 years old or older. Diabetes Have regular diabetes screenings. This checks your fasting blood sugar level. Have the screening done:  Once every three years after age 40 if you are at a normal weight and have a low risk for diabetes.  More often and at a younger age if you are overweight or have a high risk for diabetes. What should I know about preventing infection? Hepatitis B If you have a higher risk for hepatitis B, you should be screened for this virus. Talk with your health care provider to find out if you are at risk for hepatitis B infection. Hepatitis C Testing is recommended for:  Everyone born from 1945 through 1965.  Anyone with known risk factors for hepatitis C. Sexually transmitted infections (STIs)  Get screened for STIs, including gonorrhea and chlamydia, if: ? You are sexually active and are younger than 63 years of age. ? You are older than 63 years of age and your health care provider tells you that you are at risk for this type of infection. ? Your sexual activity has changed since you were last screened, and you are at increased risk for chlamydia or gonorrhea. Ask your health care provider if   you are at risk.  Ask your health care provider about whether you are at high risk for HIV. Your health care provider may recommend a prescription medicine to help prevent HIV infection. If you choose to take medicine to prevent HIV, you should first get tested for HIV. You should then be tested every 3 months for as long as you are taking the medicine. Pregnancy  If you are about to stop having your period (premenopausal) and  you may become pregnant, seek counseling before you get pregnant.  Take 400 to 800 micrograms (mcg) of folic acid every day if you become pregnant.  Ask for birth control (contraception) if you want to prevent pregnancy. Osteoporosis and menopause Osteoporosis is a disease in which the bones lose minerals and strength with aging. This can result in bone fractures. If you are 65 years old or older, or if you are at risk for osteoporosis and fractures, ask your health care provider if you should:  Be screened for bone loss.  Take a calcium or vitamin D supplement to lower your risk of fractures.  Be given hormone replacement therapy (HRT) to treat symptoms of menopause. Follow these instructions at home: Lifestyle  Do not use any products that contain nicotine or tobacco, such as cigarettes, e-cigarettes, and chewing tobacco. If you need help quitting, ask your health care provider.  Do not use street drugs.  Do not share needles.  Ask your health care provider for help if you need support or information about quitting drugs. Alcohol use  Do not drink alcohol if: ? Your health care provider tells you not to drink. ? You are pregnant, may be pregnant, or are planning to become pregnant.  If you drink alcohol: ? Limit how much you use to 0-1 drink a day. ? Limit intake if you are breastfeeding.  Be aware of how much alcohol is in your drink. In the [place] one drink equals one 12 oz bottle of beer (355 mL), one 5 oz glass of wine (148 mL), or one 1 oz glass of hard liquor (44 mL). General instructions  Schedule regular health, dental, and eye exams.  Stay current with your vaccines.  Tell your health care provider if: ? You often feel depressed. ? You have ever been abused or do not feel safe at home. Summary  Adopting a healthy lifestyle and getting preventive care are important in promoting health and wellness.  Follow your health care provider's instructions about healthy  diet, exercising, and getting tested or screened for diseases.  Follow your health care provider's instructions on monitoring your cholesterol and blood pressure. This information is not intended to replace advice given to you by your health care provider. Make sure you discuss any questions you have with your health care provider. Document Revised: 12/24/2017 Document Reviewed: 12/24/2017 Elsevier Patient Education  2020 Elsevier Inc.  

## 2019-11-26 MED ORDER — ESTRADIOL 0.1 MG/GM VA CREA: 1.0000 | TOPICAL_CREAM | Freq: Every day | VAGINAL | 12 refills | Status: AC

## 2019-11-26 MED ORDER — POTASSIUM CHLORIDE CRYS ER 20 MEQ PO TBCR: 20.0000 meq | EXTENDED_RELEASE_TABLET | Freq: Every day | ORAL | 11 refills | Status: DC

## 2019-11-26 MED ORDER — FUROSEMIDE 20 MG PO TABS: ORAL_TABLET | ORAL | 3 refills | Status: DC

## 2019-11-26 MED ORDER — ELETRIPTAN HYDROBROMIDE 20 MG PO TABS: 20.0000 mg | ORAL_TABLET | ORAL | 6 refills | Status: AC | PRN

## 2019-11-26 MED ORDER — ATORVASTATIN CALCIUM 10 MG PO TABS: ORAL_TABLET | ORAL | 3 refills | Status: DC

## 2019-11-26 NOTE — Assessment & Plan Note (Signed)
BP at goal on lasix. Checking CMP and adjust as needed.

## 2019-11-26 NOTE — Assessment & Plan Note (Signed)
Flu shot up to date. Covid-19 up to date. Shingrix counseled. Tetanus up to date. Colonoscopy up to date. Mammogram up to date, pap smear up to date. Counseled about sun safety and mole surveillance. Counseled about the dangers of distracted driving. Given 10 year screening recommendations.

## 2019-11-26 NOTE — Assessment & Plan Note (Signed)
Refill lipitor, checking lipid panel and adjust as needed.

## 2020-03-14 ENCOUNTER — Ambulatory Visit: Payer: BC Managed Care – PPO | Admitting: Internal Medicine

## 2020-03-24 ENCOUNTER — Ambulatory Visit: Payer: BC Managed Care – PPO | Admitting: Internal Medicine

## 2020-03-24 ENCOUNTER — Encounter: Payer: Self-pay | Admitting: Internal Medicine

## 2020-03-24 ENCOUNTER — Ambulatory Visit (INDEPENDENT_AMBULATORY_CARE_PROVIDER_SITE_OTHER): Payer: BC Managed Care – PPO

## 2020-03-24 ENCOUNTER — Other Ambulatory Visit: Payer: Self-pay

## 2020-03-24 VITALS — BP 132/80 | HR 81 | Temp 98.2°F | Resp 18 | Ht 63.0 in | Wt 124.6 lb

## 2020-03-24 DIAGNOSIS — R001 Bradycardia, unspecified: Secondary | ICD-10-CM | POA: Diagnosis not present

## 2020-03-24 DIAGNOSIS — I493 Ventricular premature depolarization: Secondary | ICD-10-CM

## 2020-03-24 DIAGNOSIS — I10 Essential (primary) hypertension: Secondary | ICD-10-CM

## 2020-03-24 LAB — T4, FREE: Free T4: 0.96 ng/dL (ref 0.60–1.60)

## 2020-03-24 LAB — TSH: TSH: 1.53 u[IU]/mL (ref 0.35–4.50)

## 2020-03-24 MED ORDER — AMLODIPINE BESYLATE 10 MG PO TABS: 10.0000 mg | ORAL_TABLET | Freq: Every day | ORAL | 3 refills | Status: AC

## 2020-03-24 NOTE — Assessment & Plan Note (Signed)
Had an episode at home with apple watch stating HR 30 and she was feeling poorly during this. Ordered holter monitor and EKG at urgent care and HR here are normal.

## 2020-03-24 NOTE — Progress Notes (Signed)
   Subjective:   Patient ID: Jordan Haynes, female    DOB: [birth year], 65 y.o.   MRN: [id number]  HPI The patient is a 64 YO female coming in for concerns about blood pressure (taking lisinopril 5 mg daily and lasix 20 mg daily, is having frequent urination which is bothersome to her as she is no longer close to a restroom and is retired now, denies chest pains or headaches, some heaviness, is tracking BP and overall well controlled last week or so) frequent PVCs (EKG at urgent care with PVCs in pattern bigeminy, HR mostly 60-70s, denies chest pains, has had a couple episodes of heaviness with the high BP readings) and episode of low HR (had a day where she was feeling off and "shlumped" over and HR 30s, she did not pass out, sat for awhile and then felt better, denies chest pains but did have some heaviness).   Review of Systems  Constitutional: Negative.   HENT: Negative.   Eyes: Negative.   Respiratory: Negative for cough, chest tightness and shortness of breath.        Chest heaviness  Cardiovascular: Negative for chest pain, palpitations and leg swelling.  Gastrointestinal: Negative for abdominal distention, abdominal pain, constipation, diarrhea, nausea and vomiting.  Musculoskeletal: Negative.   Skin: Negative.   Neurological: Negative.   Psychiatric/Behavioral: Negative.     Objective:  Physical Exam Constitutional:      Appearance: She is well-developed.  HENT:     Head: Normocephalic and atraumatic.  Cardiovascular:     Rate and Rhythm: Normal rate and regular rhythm.  Pulmonary:     Effort: Pulmonary effort is normal. No respiratory distress.     Breath sounds: Normal breath sounds. No wheezing or rales.  Abdominal:     General: Bowel sounds are normal. There is no distension.     Palpations: Abdomen is soft.     Tenderness: There is no abdominal tenderness. There is no rebound.  Musculoskeletal:     Cervical back: Normal range of motion.  Skin:     General: Skin is warm and dry.  Neurological:     Mental Status: She is alert and oriented to person, place, and time.     Coordination: Coordination normal.     Vitals:   03/24/20 0847  BP: 132/80  Pulse: 81  Resp: 18  Temp: 98.2 F (36.8 C)  TempSrc: Oral  SpO2: 98%  Weight: 124 lb 9.6 oz (56.5 kg)  Height: 5\' 3"  (1.6 m)    This visit occurred during the SARS-CoV-2 public health emergency.  Safety protocols were in place, including screening questions prior to the visit, additional usage of staff PPE, and extensive cleaning of exam room while observing appropriate contact time as indicated for disinfecting solutions.   Assessment & Plan:

## 2020-03-24 NOTE — Assessment & Plan Note (Signed)
Ordered holter with recent HR 30 there are some concerns for pauses etc.

## 2020-03-24 NOTE — Assessment & Plan Note (Signed)
Stop lasix per patient preference. Keep lisinopril 5 mg and add amlodipine 10 mg daily.

## 2020-03-24 NOTE — Patient Instructions (Signed)
We will have you do the holter monitor at home which will come in the mail.   We are checking the labs today for thyroid.  We have sent in amlodipine to take 1 pill daily and keep taking lisinopril 5 mg daily.

## 2020-03-28 DIAGNOSIS — I493 Ventricular premature depolarization: Secondary | ICD-10-CM

## 2020-04-24 ENCOUNTER — Other Ambulatory Visit: Payer: Self-pay | Admitting: Internal Medicine

## 2020-04-24 DIAGNOSIS — R008 Other abnormalities of heart beat: Secondary | ICD-10-CM

## 2020-11-28 IMAGING — DX DG HAND COMPLETE 3+V*R*
3 series · 3 of 3 positions shown · non-contrast
Comparison: None.

CLINICAL DATA: Right hand pain and swelling for 2 months.

EXAM:
RIGHT HAND - COMPLETE 3+ VIEW

[hand ap]
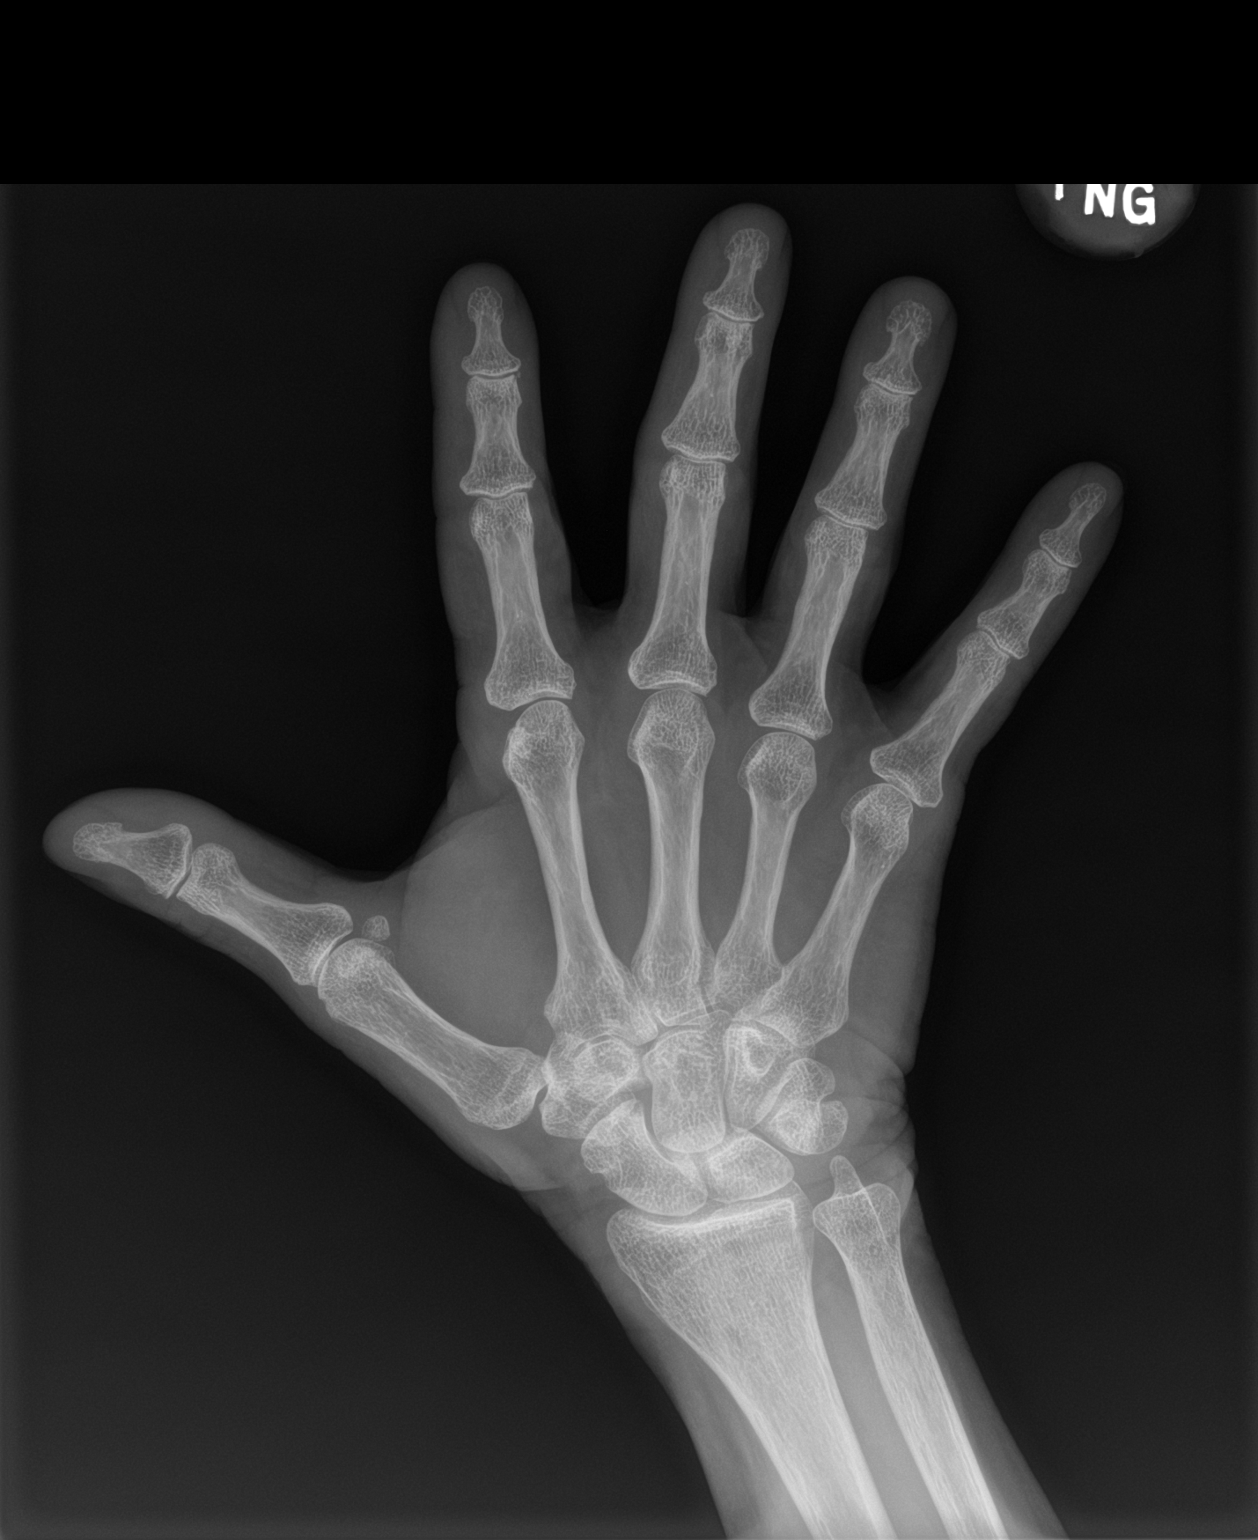

[hand obl]
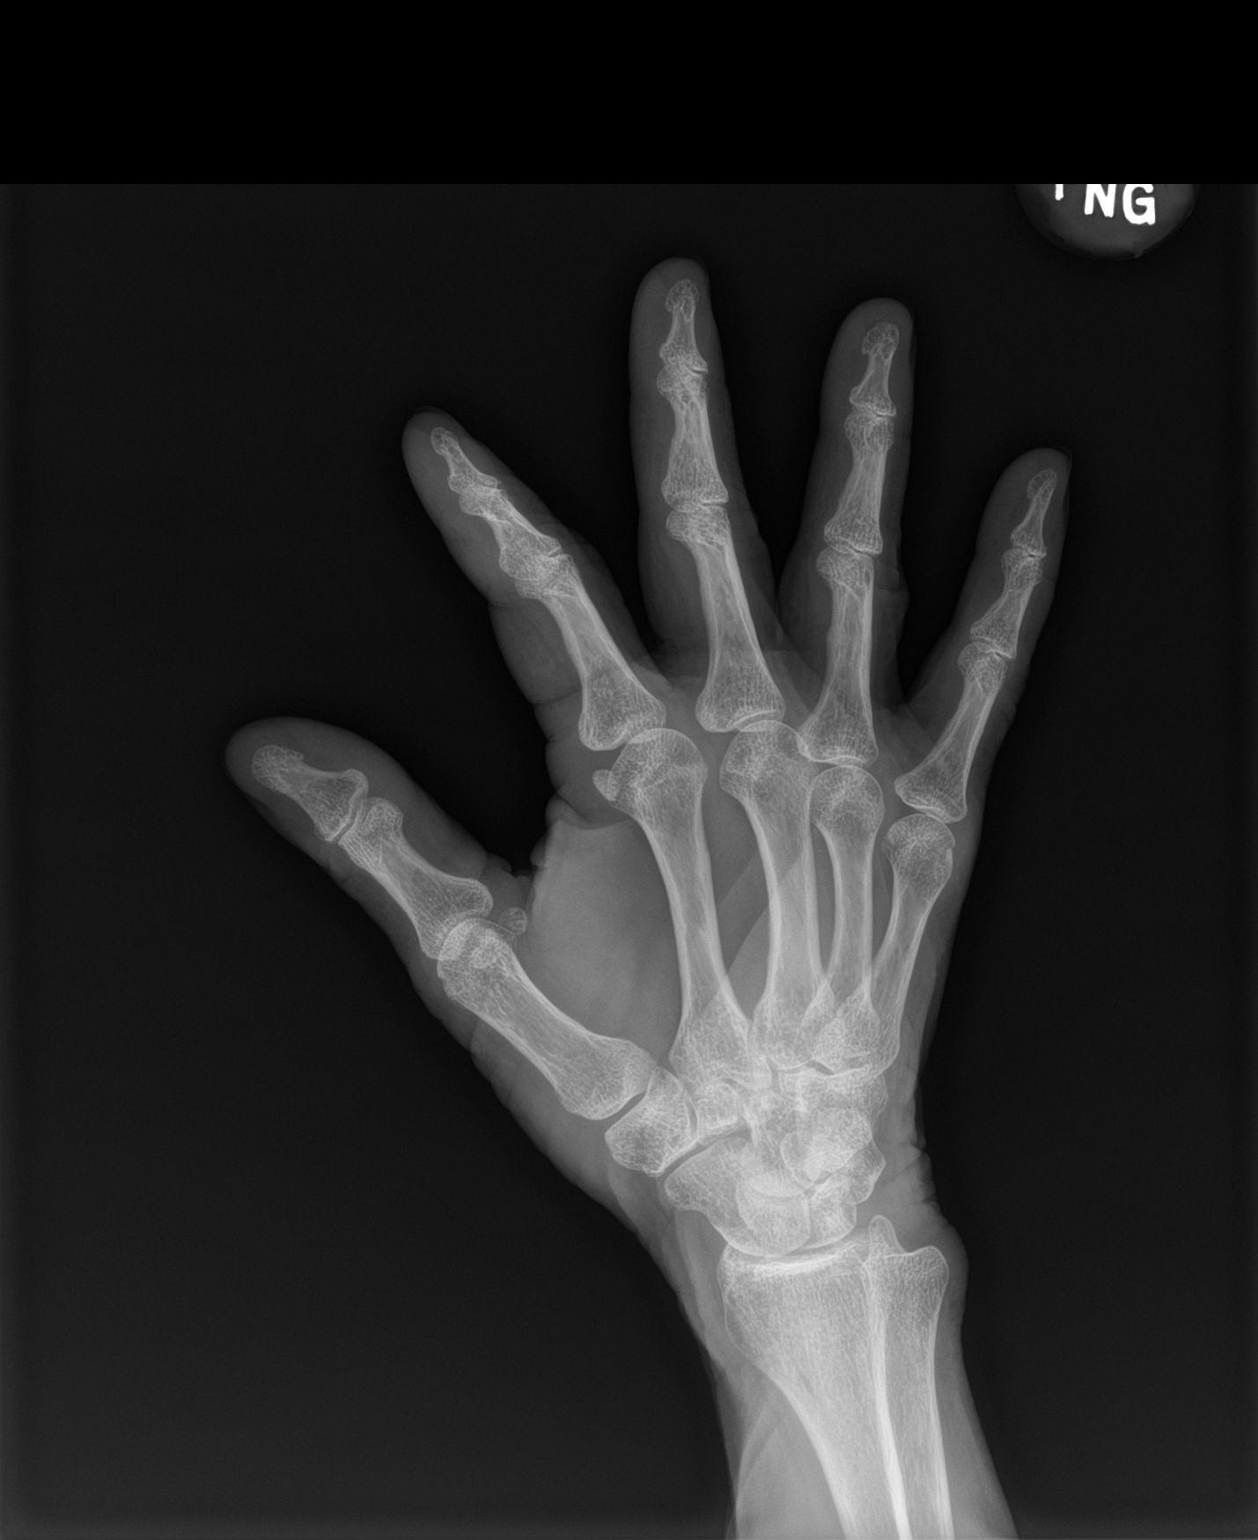

[hand lat]
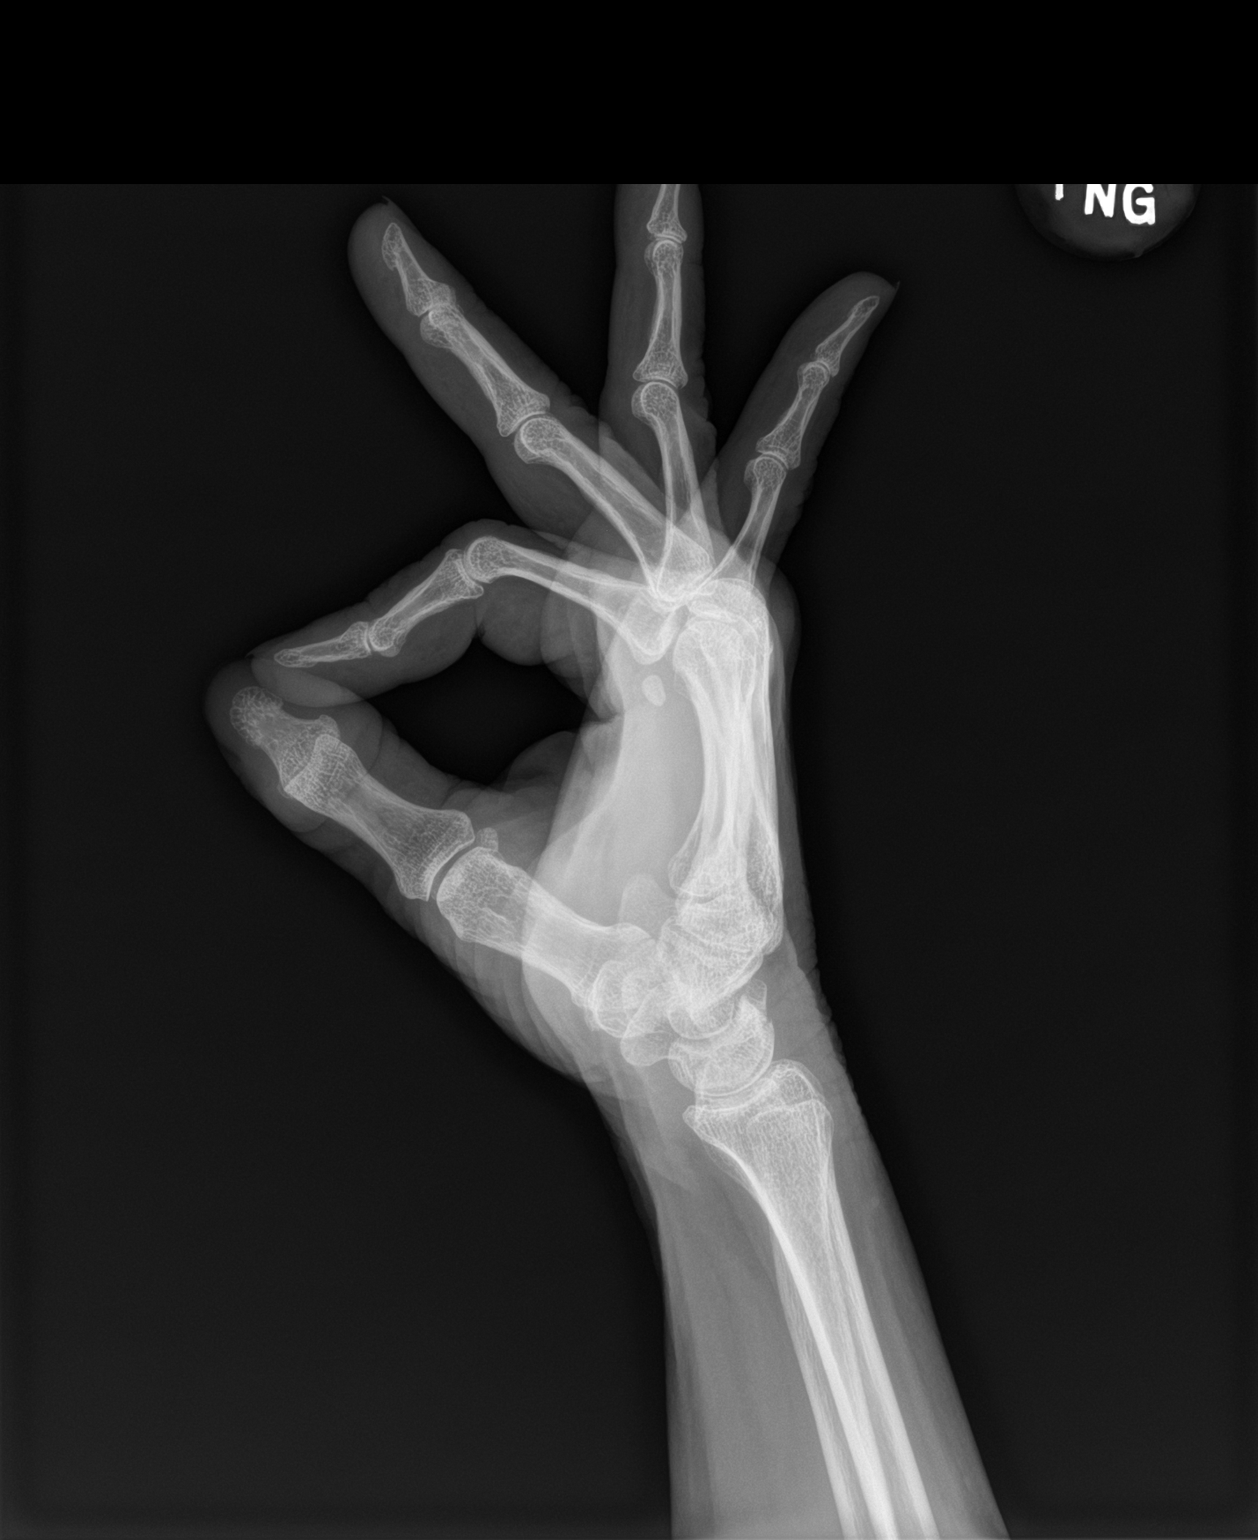

[3 of 3 positions shown; findings below may reference images not displayed]

FINDINGS: There is no evidence of fracture or dislocation. There is no
evidence of arthropathy or other focal bone abnormality. Soft
tissues are unremarkable.
IMPRESSION: Negative.

## 2021-01-02 ENCOUNTER — Other Ambulatory Visit: Payer: Self-pay | Admitting: Internal Medicine
